# Patient Record
Sex: Female | Born: 1955 | Race: White | Hispanic: No | Marital: Single | Smoking: Former smoker
Health system: Western US, Academic
[De-identification: ages and names within clinical notes are randomized; demographics above are authoritative.]

## PROBLEM LIST (undated history)

## (undated) DIAGNOSIS — M329 Systemic lupus erythematosus, unspecified: Secondary | ICD-10-CM

## (undated) DIAGNOSIS — E039 Hypothyroidism, unspecified: Secondary | ICD-10-CM

## (undated) DIAGNOSIS — I2721 Secondary pulmonary arterial hypertension: Secondary | ICD-10-CM

## (undated) HISTORY — PX: NO PAST SURGICAL HISTORY: U1045

## (undated) HISTORY — DX: Hypothyroidism, unspecified: E03.9

## (undated) HISTORY — DX: Secondary pulmonary arterial hypertension (CMS-HCC): I27.21

## (undated) HISTORY — PX: THYROIDECTOMY: SHX17

## (undated) HISTORY — PX: PARATHYROIDECTOMY: SHX19

## (undated) HISTORY — DX: Systemic lupus erythematosus, unspecified (CMS-HCC): M32.9

---

## 2001-07-13 ENCOUNTER — Other Ambulatory Visit (INDEPENDENT_AMBULATORY_CARE_PROVIDER_SITE_OTHER): Payer: Self-pay | Admitting: Internal Medicine

## 2001-10-12 ENCOUNTER — Other Ambulatory Visit (INDEPENDENT_AMBULATORY_CARE_PROVIDER_SITE_OTHER): Payer: Self-pay | Admitting: Internal Medicine

## 2001-10-12 LAB — APTT, BLOOD
PTT, Control: 29 s
PTT: 28.7 s (ref 25.0–33.0)

## 2001-10-12 LAB — HEMOGRAM, BLOOD
Hct: 39.2 % (ref 36.0–46.0)
Hgb: 13 gm/dL (ref 12.0–16.0)
MCH: 29.5 pg (ref 27–31)
MCHC: 33.1 % (ref 32–37)
MCV: 89.3 um3 (ref 82.0–98.0)
MPV: 10.2 fl (ref 7.4–10.4)
Plt Count: 141 10*3/uL (ref 130–400)
RBC: 4.39 10*6/uL (ref 4.00–5.00)
RDW: 14.6 % (ref 10–15)
WBC: 3.9 10*3/uL — ABNORMAL LOW (ref 4.0–11.0)

## 2001-10-12 LAB — BASIC METABOLIC PANEL, BLOOD
BUN: 17 mg/dL (ref 8–18)
Bicarbonate: 27 mEq/L (ref 24–31)
Calcium: 9.3 mg/dL (ref 9.0–10.6)
Chloride: 105 mEq/L (ref 97–107)
Creatinine: 0.7 mg/dL (ref 0.5–1.5)
Glucose: 97 mg/dL (ref 65–110)
Potassium: 3.7 mEq/L (ref 3.5–5.0)
Sodium: 141 mEq/L (ref 135–145)

## 2001-10-12 LAB — LIVER PANEL, BLOOD
ALT (SGPT): 13 IU/L (ref 10–45)
AST (SGOT): 17 IU/L (ref 10–45)
Albumin: 3.6 gm/dL (ref 3.3–5.0)
Alkaline Phos: 45 IU/L (ref 30–130)
Bilirubin, Dir: <0.1 mg/dL (ref <0.2)
Bilirubin, Tot: 0.6 mg/dL (ref <1.2)
Total Protein: 6 gm/dL (ref 6.0–8.0)

## 2001-10-12 LAB — PROTHROMBIN TIME, BLOOD
INR: 1
Protime, Control: 10.1 s
Protime: 10.2 s (ref 9–12)

## 2002-05-15 ENCOUNTER — Other Ambulatory Visit (HOSPITAL_BASED_OUTPATIENT_CLINIC_OR_DEPARTMENT_OTHER): Payer: Self-pay | Admitting: Rheumatology

## 2002-05-15 LAB — BASIC METABOLIC PANEL, BLOOD
BUN: 13 mg/dL (ref 8–18)
Bicarbonate: 29 mEq/L (ref 24–31)
Calcium: 10.8 mg/dL — ABNORMAL HIGH (ref 9.0–10.6)
Chloride: 106 mEq/L (ref 97–107)
Creatinine: 0.7 mg/dL (ref 0.5–1.5)
Glucose: 118 mg/dL — ABNORMAL HIGH (ref 65–110)
Potassium: 3.9 mEq/L (ref 3.5–5.0)
Sodium: 143 mEq/L (ref 135–145)

## 2002-05-15 LAB — HEMOGRAM, BLOOD
Hct: 37.5 % (ref 36.0–46.0)
Hgb: 12.9 gm/dL (ref 12.0–16.0)
MCH: 28.5 pg (ref 27–31)
MCHC: 34.4 % (ref 32–37)
MCV: 82.9 um3 (ref 82.0–98.0)
MPV: 10.6 fl — ABNORMAL HIGH (ref 7.4–10.4)
Plt Count: 174 10*3/uL (ref 130–400)
RBC: 4.52 10*6/uL (ref 4.00–5.00)
RDW: 14.4 % (ref 10–15)
WBC: 5.1 10*3/uL (ref 4.0–11.0)

## 2002-05-15 LAB — URINALYSIS
Bilirubin: NEGATIVE
Blood: NEGATIVE
Glucose: NEGATIVE
Ketones: NEGATIVE
Leuk Esterase: NEGATIVE
Nitrite: NEGATIVE
Protein: NEGATIVE
Specific Gravity: 1.015 (ref 1.002–1.030)
Urobilinogen: 0.2 (ref 0.2–?)
pH: 8 (ref 5.0–8.0)

## 2002-05-15 LAB — SED RATE, BLOOD: Sed Rate: 5 mm/hr (ref 0–20)

## 2002-05-17 LAB — C3 THORNTON, BLOOD: C3: 88 mg/dL (ref 88–168)

## 2002-05-17 LAB — C4 THORNTON, BLOOD: C4: 18 mg/dL (ref 12–40)

## 2002-07-02 ENCOUNTER — Other Ambulatory Visit (INDEPENDENT_AMBULATORY_CARE_PROVIDER_SITE_OTHER): Payer: Self-pay | Admitting: Internal Medicine

## 2002-07-02 LAB — LIVER PANEL, BLOOD
ALT (SGPT): 24 IU/L (ref 10–45)
AST (SGOT): 24 IU/L (ref 10–45)
Albumin: 3.3 gm/dL (ref 3.3–5.0)
Alkaline Phos: 70 IU/L (ref 30–130)
Bilirubin, Dir: 0.1 mg/dL (ref <0.2)
Bilirubin, Tot: 0.7 mg/dL (ref <1.2)
Total Protein: 5.8 gm/dL — ABNORMAL LOW (ref 6.0–8.0)

## 2002-07-02 LAB — APTT, BLOOD
PTT, Control: 29 s
PTT: 29.5 s (ref 25.0–33.0)

## 2002-07-02 LAB — PROTHROMBIN TIME, BLOOD
INR: 1
Protime, Control: 10.1 s
Protime: 10.4 s (ref 9–12)

## 2002-07-02 LAB — BASIC METABOLIC PANEL, BLOOD
BUN: 13 mg/dL (ref 8–18)
Bicarbonate: 27 mEq/L (ref 24–31)
Calcium: 10.4 mg/dL (ref 9.0–10.6)
Chloride: 109 mEq/L — ABNORMAL HIGH (ref 97–107)
Creatinine: 0.7 mg/dL (ref 0.5–1.5)
Glucose: 93 mg/dL (ref 65–110)
Potassium: 3.8 mEq/L (ref 3.5–5.0)
Sodium: 142 mEq/L (ref 135–145)

## 2002-07-02 LAB — HEMOGRAM, BLOOD
Hct: 37.8 % (ref 36.0–46.0)
Hgb: 12.6 gm/dL (ref 12.0–16.0)
MCH: 27.8 pg (ref 27–31)
MCHC: 33.4 % (ref 32–37)
MCV: 83.3 um3 (ref 82.0–98.0)
MPV: 10.1 fl (ref 7.4–10.4)
Plt Count: 133 10*3/uL (ref 130–400)
RBC: 4.54 10*6/uL (ref 4.00–5.00)
RDW: 14.4 % (ref 10–15)
WBC: 3.9 10*3/uL — ABNORMAL LOW (ref 4.0–11.0)

## 2002-07-02 LAB — TSH SENSITIVE, BLOOD: TSH Sensitive: <0.03 u[IU]/mL (ref 0.35–5.50)

## 2002-07-02 LAB — TOTAL T3, BLOOD: T3 Total: 335 ng/dL — ABNORMAL HIGH (ref 60–181)

## 2002-07-02 LAB — THYROXINE(T4), BLOOD: Thyroxine (T4): 14.6 ug/dL — ABNORMAL HIGH (ref 4.5–10.9)

## 2002-07-02 LAB — FREE THYROXINE, BLOOD: Free T4: 3.66 ng/dL — ABNORMAL HIGH (ref 0.90–1.80)

## 2004-06-02 ENCOUNTER — Encounter (HOSPITAL_BASED_OUTPATIENT_CLINIC_OR_DEPARTMENT_OTHER): Admitting: Rheumatology

## 2004-06-16 ENCOUNTER — Ambulatory Visit (HOSPITAL_BASED_OUTPATIENT_CLINIC_OR_DEPARTMENT_OTHER): Admitting: Rheumatology

## 2004-06-17 NOTE — Progress Notes (Signed)
CLINIC: [facility]      REPORT TYPE: NOTE      Dictating Practitioner: Eugenia Mcalpine. Laverle Patter, M.D.    DATE OF SERVICE: 06/16/2004    REASON FOR VISIT: SLE        SUBJECTIVE: Wanda Mathis reports she has had an excellent year since the last  visit, although she did have medical problems in other spheres, but nothing  related to her lupus. She was found to have hyperparathyroidism and also a  recurrence of thyroiditis and she had both parathyroid and thyroid gland  removed surgically. She has done much better postop. Throughout all that,  her lupus did not flare. There was no eruption of dermatitis and she had  no flaring joint symptoms. She maintained her stable regimen of Plaquenil  200 mg twice a day and prednisone 6 mg daily. Her pulmonary hypertension  is remaining under control and she continues to take Tracleer 250 mg daily.  She also remains on Coumadin for prophylaxis for DVT.    PHYSICAL EXAMINATION: Physical examination confirms the absence of skin  rash and joint inflammation.    LABORATORY STUDIES: Laboratory studies done six months ago showed no sign  of lupus activity.    ASSESSMENT/PLAN: She is doing well. We will make no change in her regimen  now. We will repeat labs screening for lupus activity and will have them  done every three months.                        Signature Derived From Controlled Access Password  Eugenia Mcalpine. Laverle Patter, M.D. 06/17/2004 13:39          DD: 06/16/2004 DT: 06/17/2004 6:40 A DocNo.: [id number]  [id number]/[id number]      Primary Care Physician:  UNKNOWN     PCP_Address2      cc:

## 2006-09-18 ENCOUNTER — Ambulatory Visit (INDEPENDENT_AMBULATORY_CARE_PROVIDER_SITE_OTHER): Admitting: Internal Medicine

## 2006-09-18 ENCOUNTER — Encounter (INDEPENDENT_AMBULATORY_CARE_PROVIDER_SITE_OTHER): Payer: Self-pay | Admitting: Internal Medicine

## 2006-09-18 ENCOUNTER — Ambulatory Visit (INDEPENDENT_AMBULATORY_CARE_PROVIDER_SITE_OTHER)

## 2006-09-18 ENCOUNTER — Other Ambulatory Visit: Payer: Self-pay

## 2006-09-18 VITALS — BP 122/76 | HR 93 | Ht 69.0 in | Wt 190.0 lb

## 2006-09-18 MED ORDER — SYNTHROID 175 MCG OR TABS: 135.00 ug | ORAL_TABLET | Freq: Every day | ORAL | Status: AC

## 2006-09-18 MED ORDER — PREDNISONE 1 MG OR TABS
ORAL_TABLET | ORAL | Status: DC
Start: ? — End: 2014-01-13

## 2006-09-18 MED ORDER — AMITRIPTYLINE HCL 25 MG OR TABS
ORAL_TABLET | ORAL | Status: DC
Start: ? — End: 2019-01-21

## 2006-09-18 MED ORDER — WARFARIN SODIUM 6 MG OR TABS
ORAL_TABLET | ORAL | Status: DC
Start: ? — End: 2019-01-21

## 2006-09-18 MED ORDER — PREDNISONE 5 MG OR TABS
ORAL_TABLET | ORAL | Status: DC
Start: ? — End: 2014-01-13

## 2006-09-18 MED ORDER — LEVOXYL 175 MCG OR TABS
ORAL_TABLET | ORAL | Status: DC
Start: ? — End: 2008-02-04

## 2006-09-18 MED ORDER — PLAQUENIL 200 MG OR TABS
ORAL_TABLET | ORAL | Status: DC
Start: ? — End: 2014-01-13

## 2006-09-19 LAB — PB ECHOCARDIOGRAM, 2-D
Cardac Output: 6.5 L/min (ref 4.0–8.0)
Cardiac Index: 3.3 L/min/m2 (ref 2.8–4.2)
LA Volume Index: 28 mL/m2 (ref 16–28)
LV Ejection Fraction: 78 % (ref >50)
PA Pressure: 67 mm Hg + CVP — ABNORMAL HIGH (ref 20–30)

## 2006-09-21 NOTE — Progress Notes (Signed)
CLINIC: [facility]    REPORT TYPE: NOTE    Dictating Practitioner: Ewing Schlein. Kirke Shaggy, M.D.    DATE OF SERVICE: 09/18/2006    REASON FOR VISIT: FOLLOWUP      HISTORY OF PRESENT ILLNESS: The patient returns for a followup visit. We  have not seen her in 3 years. She has pulmonary arterial hypertension  associated with lupus. Overall, the patient has been maintained on bosentan  125 b.i.d. single therapy for the past few years. She had been troubled  with severe thyrotoxicosis due to Graves disease. She has unilateral  exophthalmos, for which she is scheduled to undergo a surgical correction.  Her ophthalmologist suggested she come down here for a preoperative  evaluation and clearance.    From an activity point of view, the patient states she is doing extremely  well. She is very active, running several companies, traveling extensively,  and notes minimal limitation to her ordinary activity. She is also on  warfarin 6 alternating with 9 daily.    PHYSICAL EXAMINATION: Blood pressure 122/76, heart rate 93, O2 saturation  96% on room air. No jugular venous distention is noted. LUNGS: Clear.  HEART: Regular, with a prominent P2 and systolic murmur at the left  sternal border. She has no peripheral edema noted.    Echocardiogram today, on preliminary report, demonstrates mild right atrial  and right ventricular enlargement, normal left ventricular function,  pulmonary artery systolic pressure estimated at 67 plus CVP.    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 I think  given the length of time since we evaluated Ms. Romaniello it is very  reasonable to perform a repeat right heart catheterization within the next  week. Based on the results of this procedure, we will consider additional  medical  therapy in the patient. I would also hold off on firm  recommendations regarding her surgical risk until I have results of her  right heart cath.                        Signature Derived From Controlled Access Password  Kye Hedden N. Kirke Shaggy, M.D. 09/21/2006 09:32 A        DD: 09/18/2006 DT: 09/19/2006 04:55 A DocNo.: [id number]  RNC/r11 [id number]      Primary Care Physician:  Christ Kick MD        cc:

## 2006-09-21 NOTE — Progress Notes (Signed)
This office note has been dictated.

## 2006-09-26 ENCOUNTER — Ambulatory Visit: Admitting: Internal Medicine

## 2006-09-26 ENCOUNTER — Encounter (INDEPENDENT_AMBULATORY_CARE_PROVIDER_SITE_OTHER): Payer: Self-pay | Admitting: Internal Medicine

## 2006-09-26 ENCOUNTER — Other Ambulatory Visit (INDEPENDENT_AMBULATORY_CARE_PROVIDER_SITE_OTHER): Payer: Self-pay | Admitting: Internal Medicine

## 2006-09-26 LAB — HEMOGRAM, BLOOD
Hct: 41.4 % (ref 36.0–46.0)
Hgb: 14 gm/dL (ref 12.0–16.0)
MCH: 30.8 pg (ref 27–31)
MCHC: 33.7 % (ref 32–37)
MCV: 91.2 um3 (ref 82.0–98.0)
MPV: 8.6 fl (ref 7.4–10.4)
Plt Count: 163 10*3/uL (ref 130–400)
RBC: 4.54 10*6/uL (ref 4.00–5.00)
RDW: 15.3 % — ABNORMAL HIGH (ref 10–15)
WBC: 5.4 10*3/uL (ref 4.0–11.0)

## 2006-09-26 LAB — COMPREHENSIVE METABOLIC PANEL, BLOOD
ALT (SGPT): 17 IU/L (ref 10–45)
AST (SGOT): 20 IU/L (ref 10–45)
Albumin: 3.9 gm/dL (ref 3.3–5.0)
Alkaline Phos: 39 IU/L (ref 30–130)
BUN: 12 mg/dL (ref 8–18)
Bicarbonate: 26 mEq/L (ref 24–31)
Bilirubin, Tot: 0.8 mg/dL (ref <1.2)
Calcium: 9 mg/dL (ref 8.8–10.3)
Chloride: 106 mEq/L (ref 97–107)
Creatinine: 0.6 mg/dL (ref 0.5–1.5)
GFR (African Amer.): >60 mL/min
GFR: >60 mL/min
Glucose: 89 mg/dL (ref 65–110)
Potassium: 3.5 mEq/L (ref 3.5–5.0)
Sodium: 143 mEq/L (ref 135–145)
Total Protein: 6.6 gm/dL (ref 6.0–8.0)

## 2006-09-26 LAB — PROTHROMBIN TIME, BLOOD
INR: 1
PT,Patient: 11.4 s (ref 9.7–12.5)

## 2006-09-26 LAB — APTT, BLOOD: PTT: 36.5 s — ABNORMAL HIGH (ref 25.0–34.0)

## 2006-09-26 NOTE — Op Note (Signed)
Dictating Practitioner: Ewing Schlein. Kirke Shaggy, M.D.     Staff Physician: Ewing Schlein. Kirke Shaggy, M.D.    Date of Operation: 09/26/2006        PREPROCEDURE DIAGNOSIS: Chronic pulmonary arterial hypertension.  POSTPROCEDURE DIAGNOSIS: Chronic pulmonary arterial hypertension.  PROCEDURE: Right heart catheterization.  SURGEON/STAFF: R. Bryten Maher    PROCEDURE: After informed consent, the patient was prepped and draped in  the usual fashion. An 8-French introducer sheath was placed in right  femoral vein without difficulty. The pulmonary artery catheter was  advanced fluoroscopically into the right pulmonary artery and the procedure  completed without complications.    FINDINGS: 1. Hemodynamically: RA = 3. PA = 70/26 (mean 43). Wedge  pressure = 7. Cardiac output = 6.03 with index of 3.02, PVR 477  dyn/sec/cm-5, and PA saturation of 77%.    With inhalation of nitric oxide there was a modest reduction in PA pressure  to 64/25 (mean 39), wedge pressure 8, right atrial pressure = 6, cardiac  output 5.43 with index of 2.7, PVR 456, and PA saturation of 79%.    IMPRESSION: Pulmonary arterial hypertension associated with lupus. There  was some modest increase in the pulmonary vascular resistance with a small  response to inhaled nitric oxide.    PLAN: We discussed with the patient. Would favor addition of Revatio 20  mg t.i.d. Another option would be inhaled iloprost. Will discuss with the  patient.                              Signature Derived From Controlled Access Password  Harshaan Whang N. Kirke Shaggy, M.D. 09/26/2006 03:38 P    DD: 09/26/2006 DT: 09/26/2006 03:21 P DocNo.: [id number]  RNC/r10 [id number].[facility]       Primary Care Physician:   Christ Kick MD        cc:

## 2006-10-17 ENCOUNTER — Encounter (INDEPENDENT_AMBULATORY_CARE_PROVIDER_SITE_OTHER): Payer: Self-pay | Admitting: Internal Medicine

## 2006-11-03 NOTE — Progress Notes (Signed)
CLINIC: [facility]    REPORT TYPE: NOTE    Dictating Practitioner: Ewing Schlein. Naftula Donahue, M.D.    DATE OF SERVICE: 10/17/2006    REASON FOR VISIT: SUPPLEMENTAL NOTE    The patient has pulmonary arterial hypertension associated with connective  tissue disease. A recent right heart catheterization demonstrated moderate  pulmonary hypertension with good cardiac output. The patient needs to have  surgery on her eye for severe unilateral exophthalmos. From a  medical/pulmonary point of view, the patient is cleared to have surgery.  She will continue her pulmonary hypertension medications, but should not  have a problem undergoing surgical correction for her eye problem.                    Signature Derived From Controlled Access Password  Montario Zilka N. Kirke Shaggy, M.D. 11/03/2006 09:59 A        DD: 10/17/2006 DT: 10/19/2006 01:41 P DocNo.: [id number]  RNC/r11 [id number]      Primary Care Physician:  Hermenia Bers MD  [facility]  [address] [place], [place] [address]    cc:

## 2007-01-15 ENCOUNTER — Encounter (INDEPENDENT_AMBULATORY_CARE_PROVIDER_SITE_OTHER): Payer: Self-pay | Admitting: Internal Medicine

## 2007-01-15 ENCOUNTER — Ambulatory Visit (INDEPENDENT_AMBULATORY_CARE_PROVIDER_SITE_OTHER): Admitting: Internal Medicine

## 2007-01-15 ENCOUNTER — Ambulatory Visit (INDEPENDENT_AMBULATORY_CARE_PROVIDER_SITE_OTHER)

## 2007-01-15 VITALS — BP 120/70 | HR 77 | Ht 69.5 in | Wt 181.0 lb

## 2007-01-15 LAB — PB ECHOCARDIOGRAM, 2-D
LV Ejection Fraction: 67 % (ref >50)
PA Pressure: 49 mm Hg + CVP — ABNORMAL HIGH (ref 20–30)

## 2007-01-15 MED ORDER — REVATIO 20 MG OR TABS
ORAL_TABLET | ORAL | Status: DC
Start: ? — End: 2008-02-04

## 2007-01-15 NOTE — Progress Notes (Signed)
This office note has been dictated.

## 2007-01-24 NOTE — Progress Notes (Signed)
CLINIC: [facility]    REPORT TYPE: NOTE    Dictating Practitioner: Ewing Schlein. Yitzchak Kothari, M.D.    DATE OF SERVICE: 01/15/2007    REASON FOR VISIT: FOLLOWUP      HISTORY OF PRESENT ILLNESS: The patient returns today for a followup visit.  Since her previous right heart catheterization in July, we added sildenafil  20 t.i.d. to her regimen. Since that time, she continues to do very well.  She has no real symptomatic limitation. She is also on bosentan 125 b.i.d.  In August, she had surgery to correct her chronic eye problem related to  hypothyroidism. This went well, although she is still healing from this  procedure.    PHYSICAL EXAMINATION: VITAL SIGNS: Blood pressure was 120/70, heart rate  77, O2 saturation 100% on room air. NECK: There is no jugular venous  distention noted. LUNGS: Clear. HEART: Regular, with normal S1, S2.  EXTREMITIES: There is no peripheral edema noted.    STUDIES: A 6-minute walk distance today was 442 m with O2 saturation 92% at  the end of exercise.    Echocardiogram today demonstrates moderate right ventricular enlargement  with estimated pulmonary artery pressure of 49 per CVP, significantly  reduced compared to her prior echocardiogram.    IMPRESSION: Pulmonary arterial hypertension associated with connective  tissue disease. The patient clinically is doing well on combination medical  therapy, with echocardiogram showing some improvement. We will not make any  changes at this point. See the patient back in approximately 6 months.                        Signature Derived From Controlled Access Password  Lateria Alderman N. Kirke Shaggy, M.D. 01/24/2007 03:07 P        DD: 01/15/2007 DT: 01/15/2007 01:36 P DocNo.: [id number]  RNC/r11 [id number]      Primary Care Physician:  Hermenia Bers MD  [facility]  [place], [place] [address]    cc:

## 2007-07-30 ENCOUNTER — Encounter (INDEPENDENT_AMBULATORY_CARE_PROVIDER_SITE_OTHER): Admitting: Internal Medicine

## 2007-07-30 ENCOUNTER — Other Ambulatory Visit (INDEPENDENT_AMBULATORY_CARE_PROVIDER_SITE_OTHER)

## 2007-09-03 ENCOUNTER — Other Ambulatory Visit (INDEPENDENT_AMBULATORY_CARE_PROVIDER_SITE_OTHER): Payer: Self-pay

## 2007-09-03 ENCOUNTER — Encounter (INDEPENDENT_AMBULATORY_CARE_PROVIDER_SITE_OTHER): Admitting: Internal Medicine

## 2007-09-17 ENCOUNTER — Encounter (HOSPITAL_COMMUNITY): Payer: Self-pay

## 2007-09-17 ENCOUNTER — Other Ambulatory Visit (INDEPENDENT_AMBULATORY_CARE_PROVIDER_SITE_OTHER)

## 2007-09-17 ENCOUNTER — Encounter (INDEPENDENT_AMBULATORY_CARE_PROVIDER_SITE_OTHER): Admitting: Internal Medicine

## 2007-11-05 ENCOUNTER — Ambulatory Visit (INDEPENDENT_AMBULATORY_CARE_PROVIDER_SITE_OTHER): Admitting: Internal Medicine

## 2007-11-05 ENCOUNTER — Ambulatory Visit (INDEPENDENT_AMBULATORY_CARE_PROVIDER_SITE_OTHER)

## 2007-11-05 ENCOUNTER — Other Ambulatory Visit: Payer: Self-pay

## 2007-11-05 ENCOUNTER — Encounter (HOSPITAL_COMMUNITY): Payer: Self-pay

## 2007-11-05 ENCOUNTER — Ambulatory Visit (HOSPITAL_COMMUNITY)

## 2007-11-06 LAB — PB ECHOCARDIOGRAM, 2-D
Cardac Output: 6.3 L/min (ref 4.0–8.0)
Cardiac Index: 3.2 L/min/m2 (ref 2.8–4.2)
LA Volume Index: 22 mL/m2 (ref 16–28)
LV Ejection Fraction: 68 % (ref >50)
PA Pressure: 54 mm Hg + CVP — ABNORMAL HIGH (ref 20–30)

## 2007-11-06 NOTE — Progress Notes (Signed)
CLINIC: [facility]    REPORT TYPE: H&P    Dictating Practitioner: Ewing Schlein. Kirke Shaggy, M.D.    DATE OF SERVICE: 11/05/2007    REASON FOR VISIT: FOLLOWUP      REASON FOR VISIT: Followup.    HISTORY OF PRESENT ILLNESS: The patient returns for a followup visit.  Overall, she is doing quite well. She continues to be quite active with  virtually normal exercise tolerance. She continues on her current medical  regimen of bosentan 125 b.i.d. and sildenafil 20 mg t.i.d.    PHYSICAL EXAMINATION: GENERAL: The patient is comfortable at rest. LUNGS:  Clear. HEART: Regular with a soft systolic murmur at the left sternal  border. ABDOMEN: Benign. EXTREMITIES: There is no peripheral edema  noted.    The 6 minute walk distance is 443 m with O2 saturation 90% at the end of  exercise.    Echocardiogram today demonstrates moderate right ventricular enlargement  with normal systolic function, estimated pulmonary artery pressure 54+  CVP.    IMPRESSION: Pulmonary arterial hypertension associated with connective  tissue disease. The patient clinically is doing reasonably well although  still has echocardiographic evidence for pulmonary hypertension. I think it  is reasonable to switch her sildenafil to tadalafil 40 mg daily following  initial 20 mg daily for one week. We will try to make the switch and I  would like to see the patient back 1 month after this change in therapy.  Otherwise, would continue unchanged her current therapy.                        Electronically signed by:  Ewing Schlein. Xolani Degracia, M.D. 11/06/2007 10:08 A        DD: 11/05/2007 DT: 11/05/2007 05:00 P DocNo.: [id number]  RNC/r11 [id number].DOM    Referring Physician:  Fransico Michael MD  [address]  [place] [address]    Primary Care Physician:  Hermenia Bers MD  [facility]  [address] [place], [place] [address]    cc:

## 2008-02-03 NOTE — Progress Notes (Signed)
This encounter is being closed administratively for the purposes of statistical clarity and integrity of the record.  The act of closure provides no guarantee of clinical oversight of contents, which are solely in the purview of the encounter provider(s).  The closing physician is acting on the authority of the Medical Records and Informatics Oversight Committee of the [facility] in sealing this encounter to prevent inadvertent late entry of clinical information.

## 2008-02-04 ENCOUNTER — Ambulatory Visit (HOSPITAL_COMMUNITY)

## 2008-02-04 ENCOUNTER — Other Ambulatory Visit: Payer: Self-pay

## 2008-02-04 ENCOUNTER — Ambulatory Visit (INDEPENDENT_AMBULATORY_CARE_PROVIDER_SITE_OTHER): Admitting: Internal Medicine

## 2008-02-04 VITALS — BP 118/83 | HR 80 | Ht 69.5 in | Wt 175.0 lb

## 2008-02-04 MED ORDER — TADALAFIL (PAH) 20 MG PO TABS
40.00 mg | ORAL_TABLET | Freq: Every day | ORAL | Status: DC
Start: ? — End: 2018-01-22

## 2008-02-19 NOTE — Progress Notes (Signed)
CLINIC: [facility]    REPORT TYPE: H&P    Dictating Practitioner: Ewing Schlein. Kirke Shaggy, M.D.    DATE OF SERVICE: 02/04/2008    REASON FOR VISIT: FOLLOWUP      SUBJECTIVE: This is a followup visit. She continues to do very well. She  is on tadalafil 40 mg daily, as well as bosentan 125 b.i.d. Her breathing  is quite good, class 1 status.    PHYSICAL EXAMINATION: VITAL SIGNS: Blood pressure 118/83, heart rate 80,  O2 saturation 94% on room air. NECK: There is no jugular venous  distention noted. LUNGS: Clear. HEART: Regular without murmurs or rubs.  ABDOMEN: Benign. EXTREMITIES: No peripheral edema.    IMPRESSION: Pulmonary arterial hypertension associated with connective  tissue disease. Follow the patient clinically, is doing very well, with no  significant functional limitation. She is tolerating the tadalafil 40 mg  daily very well. Will continue current regimen and have her return in 6  months.                        Electronically signed by:  Ewing Schlein. Kirke Shaggy, M.D. 02/19/2008 01:48 P        DD: 02/04/2008 DT: 02/04/2008 03:53 P DocNo.: [id number]  RNC/r11 [id number]    Referring Physician:  Hermenia Bers MD  [facility]  [place] [address]    Primary Care Physician:  Hermenia Bers MD  [address]  [address]  [place], [place] [address]    cc:

## 2008-04-16 NOTE — Progress Notes (Signed)
This encounter is being closed administratively for the purposes of statistical clarity and integrity of the record.  The act of closure provides no guarantee of clinical oversight of contents, which are solely in the purview of the encounter provider(s).  The closing physician is acting on the authority of the Medical Records and Informatics Oversight Committee of the [facility] in sealing this encounter to prevent inadvertent late entry of clinical information.

## 2008-07-14 ENCOUNTER — Encounter (INDEPENDENT_AMBULATORY_CARE_PROVIDER_SITE_OTHER): Payer: Self-pay | Admitting: Hematology

## 2008-07-14 ENCOUNTER — Other Ambulatory Visit: Payer: Self-pay

## 2008-07-14 ENCOUNTER — Ambulatory Visit (HOSPITAL_COMMUNITY)

## 2008-07-14 ENCOUNTER — Ambulatory Visit (INDEPENDENT_AMBULATORY_CARE_PROVIDER_SITE_OTHER): Admitting: Internal Medicine

## 2008-07-14 MED ORDER — BOSENTAN 125 MG OR TABS
125.00 mg | ORAL_TABLET | Freq: Two times a day (BID) | ORAL | Status: DC
Start: ? — End: 2017-04-19

## 2008-07-14 NOTE — Progress Notes (Signed)
Pulmonary hypertension clinic [facility]     REASON FOR VISIT: FOLLOWUP  Attending physician Dr Selena Batten    HISTORY OF PRESENT ILLNESS: 53 yr old female with history of idiopathic pulmonary artery hypertension is here for follow up.  She was diagnosed with idiopathic pulmonary hypertension in 1996 when she was admitted  to hospital with shortness of breath and had cardiac cath which showed pulmonary artery hypertension.She has history of SLE and Graves disease and has thyroidectomy now on replacement levothyroxine.She was enrolled in remodulin trial and was on remodulin for 10 years finishing in 2004.She was started on revatio which was changed to tadalafil and bosentan and was also started on coumadin.  Since that time, she continues to do very well.  She has no real symptomatic limitation.She can walk for miles with out getting sob.She had six minute exercise test which showed that she is able to walk 446 meters.She is compliant with medication and she denies any gum bleeding,bleeding from other body part,denies any chest pain ,fatigue or any other symptoms.       Current outpatient prescriptions ordered prior to encounter   Medication Sig Dispense Refill   . Tadalafil, PAH, (ADCIRCA) 20 MG TABS Take 40 mg by mouth daily.       Marland Kitchen PREDNISONE 5 MG OR TABS 1 tab daily       . PREDNISONE 1 MG OR TABS 1 tab daily       . levothyroxine (SYNTHROID) 175 MCG tablet Take 175 mcg by mouth daily.       . WARFARIN SODIUM 6 MG OR TABS 1 tab daily alt with 1 1/2 tab every other day       . PLAQUENIL 200 MG OR TABS 1 tab twice daily       . AMITRIPTYLINE HCL 25 MG OR TABS 1 tab at bedtime           Filed Vitals:    07/14/2008  2:31 PM   BP: 125/90   Pulse: 92   Height: 5\' 9"  (1.753 m)   Weight: 185 lb (83.915 kg)   SpO2: 99%     has ulnar deviation of both hands.  chest clear to auscultation ,no added sounds.  CVS:RRR,,jvp 5 cm ,No peripheral edema.normal s1, pulmonary component accentuated,has systolic TR murmur.  Abdomen:soft,non  tender,non distended.    Assessment:53 yr old female is in pulmonary clinic for follow up of her idiopathic pulmonary arterial hypertension and she is well compensated and asymptomatic with medications.    Plan:  -advised to continue bosentan 125mg  bid  -advised to take tadalafil 40mg  daily and medication was refilled.  -advised to continue coumadin and the goal INR is 1.5-2 and needs to follow up with coumadin clinic.  F/u 6 months.  Discussed with Dr Selena Batten.

## 2008-07-21 NOTE — Progress Notes (Signed)
I examined the patient and discussed with Dr. Randel Books.  I reviewed all pertinent data.  Agree with his recs.  Continue combination oral therapy for PAH.  RTC in 6 months or sooner if needed.  Hansel Starling, MD

## 2008-12-15 ENCOUNTER — Ambulatory Visit (INDEPENDENT_AMBULATORY_CARE_PROVIDER_SITE_OTHER): Admitting: Internal Medicine

## 2008-12-15 NOTE — Progress Notes (Signed)
See dictation

## 2008-12-16 ENCOUNTER — Ambulatory Visit
Admit: 2008-12-16 | Discharge: 2008-12-08 | Payer: Self-pay | Source: Ambulatory Visit | Attending: Internal Medicine | Admitting: Internal Medicine

## 2008-12-16 ENCOUNTER — Encounter (INDEPENDENT_AMBULATORY_CARE_PROVIDER_SITE_OTHER): Payer: Self-pay | Admitting: Internal Medicine

## 2008-12-18 NOTE — Progress Notes (Signed)
CLINIC: [facility]      REPORT TYPE: NOTE      Dictating Practitioner: Juliette Alcide, M.D.    DATE OF SERVICE: 12/15/2008    REASON FOR VISIT: PULMONARY HYPERTENSION          PULMONARY HYPERTENSION CLINIC AT [facility]    HISTORY OF PRESENT ILLNESS: The patient was last seen in April. She lives  in [place]. She is scheduled for a followup right heart  catheterization tomorrow. The patient is anticipating left shoulder  arthroscopic surgery. I am told this would be under general anesthesia and  estimated to take approximately 2 hours. There are some concerns, both from  the patient and local doctors regarding her recent echocardiogram. I do not  have the formal report. Per patient, her PA systolic pressure was estimated  in the 70s. Part of her visit here is to obtain medical clearance in light  of her pulmonary arterial hypertension and anticipated surgery.    On history, she is functional class II. She is doing well and tolerating  all her medications. She has recently undergone some adjustments with her  Synthroid.    REVIEW OF SYSTEMS: Otherwise noncontributory.    MEDICATIONS  1. Tracleer 125 b.i.d.  2. Adcirca 40 mg daily.  3. Prednisone 6 mg daily.  4. Synthroid 175 mcg daily (needs to be verified).  5. Warfarin 6 mg 1 tablet alternating with 1-1/2 tablet daily.  6. Plaquenil 200 mg b.i.d.  7. Amitriptyline 25 mg at bedtime.    EXAMINATION  GENERAL: Pleasant, in no acute distress.  VITAL SIGNS: Blood pressure 113/78, pulse 87 and regular, 181 pounds,  saturating 97% on room air.  NECK: Exam findings significant for absent JVD.  CARDIAC: Regular rate and rhythm, with accentuated P2, a 3/6 TR, no RV  heave, no S3.  LUNGS: Clear to auscultation throughout.  ABDOMEN: Soft, nontender. No hepatomegaly.  EXTREMITIES: She has no peripheral cyanosis or edema.    IMPRESSION  1. Idiopathic pulmonary arterial hypertension.  2. Functional class II, on combination therapy.  3. History of SLE.  4. Requesting clearance  for left shoulder surgery under general  anesthesia.    RECOMMENDATIONS  1. I have explained to the patient the limitations of echocardiogram in  pulmonary arterial hypertension. Certainly, the one information of her PA  pressure estimate does not tell me whether she has other qualitative  changes to be concerned about. I am encouraged by her functional class II  status and my exam findings. We will also proceed with right heart  catheterization and compare it to her previous catheterizations. By  tomorrow, I will have a better idea of her risk assessment for undergoing  planned surgery.  2. Additional recommendations will be made following the right heart  catheterization.                    Electronically signed by:  Juliette Alcide, M.D. 12/18/2008 09:01 A        DD: 12/15/2008 DT: 12/15/2008 03:42 P DocNo.: [id number]  HSK/r11 [id number].DOM      Primary Care Physician:  Hermenia Bers MD  [address]  [address]  [place], [place] [address]    cc:

## 2008-12-18 NOTE — Op Note (Signed)
Dictating Practitioner: Juliette Alcide, M.D.     Staff Physician: Juliette Alcide, M.D.    Date of Operation: 12/16/2008            PREOPERATIVE DIAGNOSIS: Pulmonary arterial hypertension.    POSTOPERATIVE DIAGNOSES: Pulmonary arterial hypertension.    PROCEDURE: Right heart catheterization with acute vasodilator study.    SURGEON/STAFF: Devoria Albe, M.D.    PROCEDURE IN DETAIL: Consent was obtained prior to the procedure. Time-out  was performed. Right groin was prepped in the usual fashion. Lidocaine 2%  was injected locally, total volume 5 mL. An 8-French sheath was inserted  over a wire on first attempt without arterial puncture. A 7.5-French GTD  Swan-Ganz catheter with a 0.028 inner wire was advanced under fluoroscopic  guidance. Zero was confirmed prior to all measurements.    Blood pressure by cuff 112/72, pulse 76 in sinus, saturating 95% on room  air. CVP mean was 1. I ha no difficulty entering the pulmonary artery. RV  systolic was 66 with RV EDP 3, PA 59/20 (36), PAOP mean 5 and there  appeared to be a good waveform. PA saturation was 78%. Thermodilution  cardiac output 7.17, index 3.6. PVR 4.3 Wood units. Fick cardiac output  estimated at 8.18.    Following these measurements the patient was given inhaled nitric oxide 40  parts per million mixed with ambient air. On iNO, PA pressures recorded  53/17 (32), PAOP mean 5. Cardiac output 6.07, index 3.1, PVR 4.4. Following  these measurements both catheters were withdrawn. Hemostasis was achieved.  The patient was sent to recovery.    IMPRESSION  1. Pulmonary arterial hypertension.  2. No acute vasoreactivity.  3. Improved hemodynamics compared to her last catheterization in 2008.  4. Functional class 2, on combination oral therapies.    RECOMMENDATIONS: The results will be discussed with the patient. These are  encouraging findings. She is currently being screened for possible  enrollment to the [place] C study. In the meantime, otherwise,  continue  current therapies.                      Electronically signed by:  Juliette Alcide, M.D. 12/18/2008 09:01 A    DD: 12/16/2008 DT: 12/16/2008 02:58 P DocNo.: [id number]  HSK/r10 [id number].[facility]      Primary Care Physician:  Hermenia Bers MD  [address]  [address]  [place], [place] [address]    cc:

## 2009-03-02 ENCOUNTER — Other Ambulatory Visit: Payer: Self-pay

## 2010-01-07 NOTE — Procedures (Signed)
FINAL        Pulmonary Function Laboratory PATIENT Information    [name], Director Last Name:Haupt    [address] First Name:Qiara    [place], [place] [address] UE#:[id number] BJ#[id number]    Ph: [phone] PATIENT LOCATION:TOP    Technician:X.Tommie Ard M.D.: Kim       EXERCISE STUDY    DATE OF STUDY:03/02/09 8:45 AM    Test Status - Preliminary or   Final?:F    Age:54 Ht: cm Wt: kg Sex:F Purpose:6 Min Walk          Page 1 76f 1 [id number]       Position Sitting Stand --- --- --- ---      % FIO2 RA RA RA --- --- ---      Speed (MPH) Rest Rest Walk --- --- ---      Elevation (%) _ _ _ _    Duration (min) 10:00 5:00 6:00 _ _ _    ARTERIAL BLOOD    GASES    PaO2 (mmHg) _ _ _ _    PaCO2 (mmHg) _ _ _ _    PH _ _ _ _    %O2Hb (Co-oximeter) _ _ _ _ _    SpO2 96 95 93 _ _ _    CARDIOVASCULAR    Heart Rate (bpm) 72 84 99 _ _ _    BP systolic (mmHg) _ _ _ _ _ _    BP diastolic (mmHg) _ _ _ _ _ _    PERCEIVED SYMPTOMS (0 - 10)    Fatigue 0 0 0 _ _ _    Breathlessness 0 0 0.5 _ _ _          Hgb (gm/dl) COHb (gm%)       Technician Comments:Patient completed protocol and walked 427   meters.

## 2010-01-07 NOTE — Procedures (Signed)
FINAL        Pulmonary Function Laboratory PATIENT Information    [name], Director Last Name:Dennin    [address] First Name:Aldean    [place], [place] [address] GN#:[id number] VH#[id number]    Ph: [phone] Visit#:    PATIENT LOCATION:TOP    Technician:LS    REFERRING M.D.: Channick,R.       EXERCISE STUDY    (Date format mm/dd/yy hh:mm) DATE OF STUDY:01/15/07 11:20 AM    Test Status - Preliminary or   Final?:F    Age:54 Ht: cm Wt: kg Sex:F Purpose:6 Min Walk          Levels 1 to 6       Page 1 20f 1 6 R-E O2 report       Position Stand --- --- --- --- ---      % FIO2 RA RA --- --- --- ---      Speed (MPH) Rest Walk --- --- --- ---      Elevation (%) _ _ _ _ _ _    Duration (min) 5:00 6:00   _    ARTERIAL BLOOD    GASES    PaO2 (mmHg) _ _ _ _    PaCO2 (mmHg) _ _ _ _          PH _ _ _ _ _    %O2Hb (Co-oximeter) _ _ _ _ _    SpO2 100 92   _    CARDIOVASCULAR    Heart Rate (bpm) 77 97   _    BP systolic (mmHg) _ _ _ _    BP diastolic (mmHg) _ _ _ _    PERCEIVED SYMPTOMS (0 - 10)    Fatigue 0   _    Breathlessness 0.5   _          Hgb (gm/dl) COHb (gm%)       Technician Comments:Protocol completed and WALKED 442 Meters.                   Results of interim report not necessarily reviewed by attending  laboratory Physician.    Measured values are occasionally modified as result of the review   by  the laboratory Physician.

## 2010-01-07 NOTE — Procedures (Signed)
FINAL        Pulmonary Function Laboratory PATIENT Information    Wanda Mathis., Director Last Name:Mondesir    [address] First Name:Wanda Mathis    [place], [place] [address] UE#:[id number] BJ#[id number]    Ph: [phone] Visit#:    PATIENT LOCATION:TOP    Technician:JB    REFERRING M.D.: Channick       EXERCISE STUDY    (Date format mm/dd/yy hh:mm) DATE OF STUDY:02/04/08 2:35 PM    Test Status - Preliminary or   Final?:F    Age:54 Ht: cm Wt: kg Sex:F Purpose:6 Min Walk          Levels 1 to 6       Page 1 68f 1 6 R-E O2 report.doc       Position Sitting Stand --- --- --- ---      % FIO2 RA RA RA --- --- ---      Speed (MPH) Rest Rest Walk --- --- ---      Elevation (%) _ _ _ _ _ _    Duration (min) 10:00 5:00 6:00 _ _ _    ARTERIAL BLOOD    GASES    PaO2 (mmHg) _ _ _ _ _ _    PaCO2 (mmHg) _ _ _ _ _ _    PH _ _ _ _ _ _    %O2Hb (Co-oximeter) _ _ _ _ _ _    SpO2 97 94 90 _ _ _    CARDIOVASCULAR    Heart Rate (bpm) 81 93 120 _ _ _    BP systolic (mmHg) _ _ _ _ _ _    BP diastolic (mmHg) _ _ _ _ _ _    PERCEIVED SYMPTOMS (0 - 10)    Fatigue 0 0 0.5 _ _ _    Breathlessness 0 0 0.5 _ _ _          Hgb (gm/dl) COHb (gm%)       Technician Comments: Patient completed protocol and walked 482   meters.                        Results of interim report not necessarily reviewed by attending  laboratory Physician.    Measured values are occasionally modified as result of the review   by  the laboratory Physician.

## 2010-01-07 NOTE — Procedures (Signed)
FINAL        Pulmonary Function Laboratory PATIENT Information    [name], Director [name]    [address] First Name:Wanda    [place], [place] [address] YQ#:[id number] EX#[id number]    Ph: [phone] Visit#:    PATIENT LOCATION:TOP    Technician:JB    REFERRING M.D.: Kim       EXERCISE STUDY    (Date format mm/dd/yy hh:mm) DATE OF STUDY:07/14/08 1:50 PM    Test Status - Preliminary or   Final?:F    Age:54 Ht: cm Wt: kg Sex:F Purpose:6 Min Walk          Levels 1 to 6       Page 1 35f 1 6 R-E O2 report       Position --- Stand --- --- --- ---      % FIO2 RA RA RA --- --- ---      Speed (MPH) --- --- --- --- --- ---      Elevation (%) _ _ _ _ _ _    Duration (min) 10:00 5:00 6:00 _ _ _    ARTERIAL BLOOD    GASES    PaO2 (mmHg) _ _ _ _ _ _    PaCO2 (mmHg) _ _ _ _ _ _    PH _ _ _ _ _ _    %O2Hb (Co-oximeter) _ _ _ _ _ _    SpO2 96 97 92 _ _ _    CARDIOVASCULAR    Heart Rate (bpm) 96 107 129 _ _ _    BP systolic (mmHg) _ _ _ _ _ _    BP diastolic (mmHg) _ _ _ _ _ _    PERCEIVED SYMPTOMS (0 - 10)    Fatigue 0 0.5 _ _ _    Breathlessness 0 1 _ _ _          Hgb (gm/dl) COHb (gm%)       Technician Comments: Patient completed protocol and WALKED 495   meters.                      Results of interim report not necessarily reviewed by attending  laboratory Physician.    Measured values are occasionally modified as result of the review   by  the laboratory Physician.

## 2010-01-07 NOTE — Procedures (Signed)
FINAL        Pulmonary Function Laboratory PATIENT Information    [name], Director Last Name:Dowse    [address] First Name:Allie    [place], [place] [address] YQ#:[id number] EX#[id number]    Ph: [phone] Visit#:    PATIENT LOCATION:TOP    Technician:B.Sabino Gasser    REFERRING M.D.: Channick       EXERCISE STUDY    (Date format mm/dd/yy hh:mm) DATE OF STUDY:11/05/07 12:25 PM    Test Status - Preliminary or   Final?:F    Age:54 Ht:176.50cm Wt:82kg Sex:F Purpose:6 Min Walk       Levels 1 to 6       Page 1 64f 1 [id number]       Position Stand --- --- --- --- ---      % FIO2 RA RA --- --- --- ---      Speed (MPH) Rest Walk --- --- --- ---      Elevation (%) _ _ _ _ _ _    Duration (min) 5:00 6:00 _ _ _ _    ARTERIAL BLOOD    GASES    PaO2 (mmHg) _ _ _ _ _ _    PaCO2 (mmHg) _ _ _ _ _ _    PH _ _ _ _ _ _    %O2Hb (Co-oximeter) _ _ _ _ _ _    SpO2 96 90 _ _ _ _    CARDIOVASCULAR    Heart Rate (bpm) 109 126 _ _ _ _    BP systolic (mmHg) _ _ _ _ _ _    BP diastolic (mmHg) _ _ _ _ _ _    PERCEIVED SYMPTOMS (0 - 10)    Fatigue _ 2 _ _ _ _    Breathlessness _ 1 _ _ _ _          Hgb (gm/dl) COHb (gm%)       Technician Comments: Patient completed protocol and walked 443   meters.

## 2011-10-03 ENCOUNTER — Encounter (HOSPITAL_COMMUNITY): Payer: Self-pay

## 2011-10-26 ENCOUNTER — Encounter (HOSPITAL_COMMUNITY): Payer: Self-pay | Admitting: Internal Medicine

## 2011-11-16 ENCOUNTER — Ambulatory Visit (HOSPITAL_COMMUNITY): Payer: Managed Care, Other (non HMO) | Admitting: Internal Medicine

## 2011-11-16 ENCOUNTER — Encounter (HOSPITAL_COMMUNITY): Payer: Self-pay | Admitting: Internal Medicine

## 2011-11-16 ENCOUNTER — Inpatient Hospital Stay (HOSPITAL_COMMUNITY): Admit: 2011-11-16 | Discharge: 2011-11-16 | Payer: Managed Care, Other (non HMO)

## 2011-11-16 VITALS — BP 131/69 | HR 82 | Temp 97.9°F | Ht 68.5 in | Wt 180.0 lb

## 2011-11-16 NOTE — Procedures (Addendum)
FINAL  [Page 1]                       Pulmonary Function Laboratory   PATIENT Information             Philis Nettle., Director      [name]           [address]            First Name:Wanda           [place], [place] [address]             UE#:[id number]  BJ#[id number]            Ph: [phone]              PATIENT LOCATION:TOP                                           Technician:JIM Felicity Pellegrini                                           REFERRING M.D.: Selena Batten                                                                                      EXERCISE STUDY                                           DATE OF STUDY:11/16/2011 12:15 PM                                                Test Status - Preliminary or   Final?:F           Age:56 Ht:          cm Wt:          kg   Sex:F Purpose:6 Min Walk                           Page 1 88f 1 [id number]                      Position           Sitting Stand  ---     ---    ---     ---           % FIO2             RA      RA     RA      ---    ---     ---  Speed (MPH)        Rest    Rest   Walk    ---    ---     ---           Elevation (%)                                                                             Duration (min)     10:00   5:00   6:00                                            ARTERIAL BLOOD                                                      GASES           PaO2 (mmHg)                                                                               PaCO2 (mmHg)                                                                              PH                                                                                        %O2Hb(Co oximeter)                                                                        SpO2               99      99     94  CARDIOVASCULAR                                                      Heart Rate (bpm)   77      92     129                                              BP systolic (mmHg)                                                                        BP diastolic (mmHg)                                                                        PERCEIVED SYMPTOMS (0 - 10)                                         Fatigue                                1.0                                         Breathlessness                         2.0                                                               Hgb (gm/dl)            COHb (gm%)                                  Technician Comments:Patient completed protocol and walked   .                                  Interpretation:                      The patient walked in 6 minutes. This is a   increase,  which represents a  15% change           from the previous test dated 03/02/2009.                      Dorann Lodge, MD [facility]

## 2011-11-16 NOTE — Procedures (Signed)
No note

## 2011-11-16 NOTE — Progress Notes (Signed)
Attending Addendum:  I examined the patient and reviewed the interval labs/results.  I discussed with Dr. Brayton Layman.  Patient relocated to [facility] and is here for [facility] follow-up.  Overall well with FC II status on combination oral therapies.  Some sinus congestion, but no other significant side-effects.  No JVD, RRR with accentuated P2 and 3/6 HSM; no HM or edema.  Explained role of warfarin in iPAH.  CPM and RTC in 6 months for follow-up.  No echo or RHC necessary at this time.    Pecola Lawless, M.D.  PID# [id number]  Pager [phone]  Pulmonary Hypertension/PTE Attending

## 2011-11-16 NOTE — Progress Notes (Signed)
PULMONARY VASCULAR CLINIC NOTE  Referring Provider: No Pcp, Per Patient  Reason for visit: Follow-up pulmonary arterial hypertension     HPI:  Wanda Mathis is a 56 year old female with history of WHO group I idopathic pulmonary arterial hypertension, possibly related to systemic lupus erythematosus who presents for follow-up after having moved back to [facility] from [place].  She was initially diagnosed with PAH in 2008.  She states that she has done well since she was last seen in this clinic in 2010.  She remains on Tracleer and Adcirca with no side effects.  She states that she can walk her dog for 30 minutes without stopping on a route that includes inclines and small hills in [facility].  She denies syncope or light-headedness.  Her SLE has been under good control with only mild arthritis complaints. Her thyroid replacement was recently reduced.  She remains on warfarin for primary prevention in [facility] and has never had bleeding problems.  No other complaints.     ROS: A twelve-point review of systems was negative unless otherwise noted in the HPI. Patient denies fevers, chills, night sweats. No abdominal pain/nausea/vomiting or stools changes. No rashes.  No other complaints.    Past Medical History   Diagnosis Date   . SLE (systemic lupus erythematosus)    . Hypothyroid    . PAH (pulmonary artery hypertension)      Past Surgical History   Procedure Laterality Date   . No past surgical history       History     Social History   . Marital Status: Single     Spouse Name: N/A     Number of Children: N/A   . Years of Education: N/A     Occupational History   . Marketing for Website      Social History Main Topics   . Smoking status: Former Smoker -- 0.25 packs/day     Types: Cigarettes     Quit date: 11/16/1983   . Smokeless tobacco: Never Used   . Alcohol Use: No   . Drug Use: No      Denies methamphetamines and diet pills   . Sexually Active: Not on file     Other Topics Concern   . Not on file      Social History Narrative   . No narrative on file     Family History   Problem Relation Age of Onset   . Heart Attack Father    . Heart Disease Father      Allergies   Allergen Reactions   . Penicillins Rash and Swelling   . Sulfa Drugs Swelling     Current Outpatient Prescriptions   Medication Sig   . AMITRIPTYLINE HCL 25 MG OR TABS 1 tab at bedtime   . bosentan (TRACLEER) 125 MG tablet Take 125 mg by mouth 2 times daily.   Marland Kitchen levothyroxine (SYNTHROID) 175 MCG tablet Take 135 mcg by mouth daily.   Marland Kitchen PLAQUENIL 200 MG OR TABS 1 tab twice daily   . PREDNISONE 1 MG OR TABS 3 tab daily   . PREDNISONE 5 MG OR TABS 1 tab daily   . Tadalafil, PAH, (ADCIRCA) 20 MG TABS Take 40 mg by mouth daily.   . WARFARIN SODIUM 6 MG OR TABS 1 tab daily alt with 1 1/2 tab every other day     No current facility-administered medications for this visit.       PHYSICAL EXAMINATION  Temperature:  [97.9 F (36.6 C)] 97.9 F (36.6 C) (08/21 1257)  Blood pressure (BP): (131)/(69) 131/69 mmHg (08/21 1257)  Heart Rate:  [82] 82  (08/21 1257)  Pain Score:  [-] 0 (08/21 1257)  O2 Device:  [-]   SpO2:  [95 %] 95 % (08/21 1257)    GEN: NAD, comfortable at rest  HEENT: EOMI, PERRLA, OP without lesions  NECK: Supple, no cervical LAD, JVD flat  CV: RRR, Prominent P2, mild TR murmur II/VI; normal S1/S2, no S3  RESP: CTAB, no wheezes/rales  ABD: soft, NT/ND, NABS  EXT: No LE edema  NEURO: non-focal, grossly intact  SKIN: No rashes    PERTINENT LABS:  LFT's: WNL per TAP program    RIGHT HEART CATHETERIZATION (11/2008): Blood pressure by cuff 112/72, pulse 76 in sinus, saturating 95% on room  air. CVP mean was 1. I ha no difficulty entering the pulmonary artery. RV systolic was 66 with RV EDP 3, PA 59/20 (36), PAOP mean 5 and there  appeared to be a good waveform. PA saturation was 78%. Thermodilution cardiac output 7.17, index 3.6. PVR 4.3 Wood units. Fick cardiac output  estimated at 8.18. Following these measurements the patient was given inhaled  nitric oxide 40 parts per million mixed with ambient air. On iNO, PA pressures recorded 53/17 (32), PAOP mean 5. Cardiac output 6.07, index 3.1, PVR 4.4.     ASSESSMENT:  55 year old  female with history of WHO group I idopathic pulmonary arterial hypertension, possibly related to systemic lupus erythematosus who presents for follow-up; currently well compensated and euvolemic with NYHA Functional Class II on Tracleer and Adcirca    PLAN:  - Continue bosentan 125 mg BID with liver monitoring per T.A.P. Program  - Continue tadalafil 40 mg daily  - Discussed relatively weak evidence for continued warfarin, but okay to continue as long as patient does not find this too burdensome and is not having any bleeding complications  - Return to clinic in 6 months    This patient was seen and discussed with Dr. Selena Batten who agrees with the assessment and plan as outlined above.    Kathi Simpers. Brayton Layman, M.D.  Pulmonary and Critical Care Fellow  Pager [phone]/PID# [id number]

## 2013-10-02 ENCOUNTER — Encounter (HOSPITAL_COMMUNITY): Payer: Self-pay | Admitting: Internal Medicine

## 2013-10-02 DIAGNOSIS — R0989 Other specified symptoms and signs involving the circulatory and respiratory systems: Principal | ICD-10-CM

## 2013-10-02 DIAGNOSIS — R0609 Other forms of dyspnea: Principal | ICD-10-CM

## 2014-01-06 ENCOUNTER — Ambulatory Visit (HOSPITAL_COMMUNITY): Payer: BLUE CROSS/BLUE SHIELD | Admitting: Internal Medicine

## 2014-01-06 ENCOUNTER — Ambulatory Visit (HOSPITAL_COMMUNITY): Payer: BLUE CROSS/BLUE SHIELD

## 2014-01-13 ENCOUNTER — Ambulatory Visit
Admission: RE | Admit: 2014-01-13 | Discharge: 2014-01-14 | Disposition: A | Discharge status: Home / Self Care | Payer: BLUE CROSS/BLUE SHIELD | Source: Ambulatory Visit | Attending: Pulmonary Medicine | Admitting: Pulmonary Medicine

## 2014-01-13 ENCOUNTER — Encounter (HOSPITAL_COMMUNITY): Payer: Self-pay | Admitting: Internal Medicine

## 2014-01-13 ENCOUNTER — Ambulatory Visit (HOSPITAL_COMMUNITY): Payer: BLUE CROSS/BLUE SHIELD

## 2014-01-13 ENCOUNTER — Ambulatory Visit: Payer: BLUE CROSS/BLUE SHIELD | Attending: Internal Medicine | Admitting: Internal Medicine

## 2014-01-13 VITALS — BP 137/84 | HR 55 | Temp 98.0°F | Resp 16 | Ht 68.5 in | Wt 186.2 lb

## 2014-01-13 DIAGNOSIS — R0602 Shortness of breath: Secondary | ICD-10-CM | POA: Insufficient documentation

## 2014-01-13 DIAGNOSIS — I272 Other secondary pulmonary hypertension: Principal | ICD-10-CM | POA: Insufficient documentation

## 2014-01-13 DIAGNOSIS — R0600 Dyspnea, unspecified: Secondary | ICD-10-CM | POA: Insufficient documentation

## 2014-01-13 DIAGNOSIS — R0689 Other abnormalities of breathing: Secondary | ICD-10-CM | POA: Insufficient documentation

## 2014-01-13 DIAGNOSIS — M329 Systemic lupus erythematosus, unspecified: Secondary | ICD-10-CM | POA: Insufficient documentation

## 2014-01-13 DIAGNOSIS — I2721 Secondary pulmonary arterial hypertension: Secondary | ICD-10-CM

## 2014-01-13 NOTE — Procedures (Signed)
No note

## 2014-01-13 NOTE — Progress Notes (Signed)
Pulmonary Hypertension Clinic  [facility], [place], [place], [place]    Attending: Pecola LawlessNick H. Josseline Reddin, M.D. [phone])    Reason for visit: Follow-up for pulmonary hypertension    Patient is here for routine follow-up since her last visit in 2013.  [facility].  She exercises regularly.  She denies problems with her PAH meds or anticoagulation.  She is now off prednisone and plaquenil with stable SLE.    ROS: she needs bunion surgery (outpatient) and colonoscopy in the near future; otherwise noncontributory    Medications:  Current Outpatient Prescriptions   Medication Sig Dispense Refill   . AMITRIPTYLINE HCL 25 MG OR TABS 1 tab at bedtime     . bosentan (TRACLEER) 125 MG tablet Take 125 mg by mouth 2 times daily.     Marland Kitchen. levothyroxine (SYNTHROID) 175 MCG tablet Take 135 mcg by mouth daily.     . Tadalafil, PAH, (ADCIRCA) 20 MG TABS Take 40 mg by mouth daily.     . WARFARIN SODIUM 6 MG OR TABS 1 tab daily alt with 1 1/2 tab every other day       No current facility-administered medications for this visit.       Exam: NAD  BP 137/84 mmHg  Pulse 55  Temp(Src) 98 F (36.7 C) (Oral)  Resp 16  Ht 5' 8.5" (1.74 m)  Wt 84.46 kg (186 lb 3.2 oz)  BMI 27.90 kg/m2  SpO2 97%  HEENT: WNL, OP clear  Neck: no TM, no LAD, supple  No JVD, RRR, snappy P2, +TR, no S3, no S4, no rub  Lungs: CTA  Abd: soft, NT, liver edge not palpable, no ascites  Ext: No edema or cyanosis    Last right heart catheterization: 2010    Impressions/Diagnoses:  1) PAH  2) NYHAC II, doing well  3) Pending procedures  4) Already received flu shot    Recommendations:  1) Option offered to switch from bosentan to macitentan (SERAPHIN data and no monthly LFT check).  She is interested in trying this.  As with any medication start or change, risk of different side effects/tolerability discussed.  In our experience with switching between these two agents (same targeted therapy) we have not had significant problems.  2) Ok to proceed with  procedures [facility] standpoint.  I recommend doing these procedures in the usual manner instead of altering (to less common approach) just because of her PAH.    [facility] or sooner if needed.    All questions were answered.    Pecola LawlessNick H. Kaylib Furness, M.D.  Clinical Professor of Medicine  Pulmonary and Critical Care Medicine  Director, Pulmonary Vascular Program  [facility]  [place], [place]

## 2014-01-15 ENCOUNTER — Ambulatory Visit (HOSPITAL_COMMUNITY): Payer: BLUE CROSS/BLUE SHIELD | Admitting: Internal Medicine

## 2014-01-15 ENCOUNTER — Ambulatory Visit (HOSPITAL_COMMUNITY): Payer: BLUE CROSS/BLUE SHIELD

## 2014-01-16 NOTE — Addendum Note (Signed)
Encounter addended by: Terressa Koyanagihelette, Jenea Dake on: 01/16/2014  8:11 AM<BR>     Documentation filed: Document, Charges VN

## 2014-10-27 ENCOUNTER — Encounter (HOSPITAL_COMMUNITY): Payer: Self-pay | Admitting: Internal Medicine

## 2014-10-27 DIAGNOSIS — R0989 Other specified symptoms and signs involving the circulatory and respiratory systems: Principal | ICD-10-CM

## 2014-10-27 DIAGNOSIS — R0609 Other forms of dyspnea: Principal | ICD-10-CM

## 2014-12-17 ENCOUNTER — Ambulatory Visit (HOSPITAL_COMMUNITY): Payer: Federal, State, Local not specified - PPO | Admitting: Internal Medicine

## 2014-12-17 ENCOUNTER — Ambulatory Visit (HOSPITAL_COMMUNITY): Payer: Federal, State, Local not specified - PPO

## 2015-01-26 ENCOUNTER — Ambulatory Visit: Payer: Federal, State, Local not specified - PPO | Attending: Internal Medicine | Admitting: Internal Medicine

## 2015-01-26 ENCOUNTER — Encounter (HOSPITAL_COMMUNITY): Payer: Self-pay | Admitting: Internal Medicine

## 2015-01-26 VITALS — BP 121/73 | HR 74 | Temp 98.1°F | Resp 17 | Ht 68.5 in | Wt 190.1 lb

## 2015-01-26 DIAGNOSIS — I272 Other secondary pulmonary hypertension: Principal | ICD-10-CM | POA: Insufficient documentation

## 2015-01-26 DIAGNOSIS — I2721 Secondary pulmonary arterial hypertension: Secondary | ICD-10-CM

## 2015-01-26 DIAGNOSIS — R0602 Shortness of breath: Secondary | ICD-10-CM | POA: Insufficient documentation

## 2015-01-26 DIAGNOSIS — M329 Systemic lupus erythematosus, unspecified: Secondary | ICD-10-CM | POA: Insufficient documentation

## 2015-01-26 MED ORDER — MACITENTAN 10 MG PO TABS
10.00 mg | ORAL_TABLET | Freq: Every day | ORAL | Status: DC
Start: ? — End: 2017-12-07

## 2015-01-26 NOTE — Progress Notes (Signed)
Pulmonary Hypertension Clinic    Chief Complaint:  Feeling well    History of Present Illness:  Wanda Mathis is a 59 year old female with history of WHO Group I PAH associated with SLE who presents for follow up. She is currently on adcirca and macitentan. She was last seen about 1 year ago at which time she was doing well. She was changed from bosentan to macitentan. Currently she is feeling very well. No side effects to the medications. Did not note any difference since switching to maci. Able to walk > 2hours at a time. Lifts weights 2 times per week. Denies any functional limitation other than maybe doing really intense aerobic work outs. Denies any syncope or chest pain. No lower extremity edema or abdominal distention. No bleeding complications related to coumadin. Says lupus is well controlled and is off immunosuppressants.     Past Medical History:  Past Medical History   Diagnosis Date   . Hypothyroid    . PAH (pulmonary artery hypertension) (CMS-HCC)    . SLE (systemic lupus erythematosus) (CMS-HCC)      Past Surgical History   Procedure Laterality Date   . No past surgical history         Allergies:  Allergies   Allergen Reactions   . Penicillins Rash and Swelling   . Sulfa Drugs Swelling       Medications:    (Not in a hospital admission)    Current Outpatient Prescriptions on File Prior to Visit   Medication Sig Dispense Refill   . AMITRIPTYLINE HCL 25 MG OR TABS 1 tab at bedtime     . bosentan (TRACLEER) 125 MG tablet Take 125 mg by mouth 2 times daily.     Marland Kitchen. levothyroxine (SYNTHROID) 175 MCG tablet Take 135 mcg by mouth daily.     . Tadalafil, PAH, (ADCIRCA) 20 MG TABS Take 40 mg by mouth daily.     . WARFARIN SODIUM 6 MG OR TABS 1 tab daily alt with 1 1/2 tab every other day       No current facility-administered medications on file prior to visit.        Social History:  Social History     Social History   . Marital status: Single     Spouse name: N/A   . Number of children: N/A   .  Years of education: N/A     Occupational History   . Marketing for Website      Social History Main Topics   . Smoking status: Former Smoker     Packs/day: 0.25     Types: Cigarettes     Quit date: 11/16/1983   . Smokeless tobacco: Never Used   . Alcohol use No   . Drug use: No      Comment: Denies methamphetamines and diet pills   . Sexual activity: Not on file     Other Topics Concern   . Not on file     Social History Narrative       Family History:  Family History   Problem Relation Age of Onset   . Heart Attack Father    . HEART DISEAS Father        Review of Systems:  10 organ ROS performed and pertinent positive and negatives are documented in HPI    Physical Exam:  BP 121/73  Pulse 74  Temp 98.1 F (36.7 C) (Oral)  Resp 17  Ht 5' 8.5" (1.74 m)  Wt 86.2 kg (190 lb 1.6 oz)  SpO2 98%  BMI 28.48 kg/m2  Gen: no acute distress, alert and oriented  Eyes: EOMI, anicteric  Neck: supple full rom  Mouth: op clear with no oral lesions  Lungs: clear bilaterally, no wheezing  CV: regular, enhanced p2, no s3 or jvd  Abd: soft, nt, +BS, no hm  Ext: no edema, warm  Skin: no rashes or sores  Neuro: non focal, moving all extremities    Labs:  Lab Results   Component Value Date    WBC 3.5 12/16/2008    HGB 13.7 12/16/2008    HCT 42.4 12/16/2008    PLT 159 12/16/2008     Lab Results   Component Value Date    NA 143 12/16/2008    K 3.4 12/16/2008    CL 107 12/16/2008    BICARB 28 12/16/2008    BUN 14 12/16/2008    CREAT 0.72 12/16/2008    GLU 73 12/16/2008    [place] 9.4 12/16/2008     Lab Results   Component Value Date    PT 11.4 12/16/2008    PTT 35.2 12/16/2008    INR 1.0 12/16/2008     No results found for: ARTPH, ARTPCO2, ARTPO2     Last RHC 2010    Assessment and Plan: Wanda Mathis is a 59 year old female with history of WHO Group I PAH associated with SLE who presents for follow up.    1. PAH: Overall remains stable. Currently NYHA FC II. Doing well on current therapy.   - continue macitentan and adcirca  - she is  considering temporary relocation to [facility], can put her in contact with [facility] docs out there if needed    2. SLE: controlled.     RTC 1 year    Seen/discussed with Dr. Eliot Ford  PCCM Fellow    Orders:  No orders of the defined types were placed in this encounter.    No orders of the defined types were placed in this encounter.    No orders of the defined types were placed in this encounter.

## 2015-01-29 NOTE — Progress Notes (Signed)
Attending Addendum:  I examined the patient and reviewed the interval labs/results.  I discussed with Dr. Patterson HammersmithMims and agree with his note.  Stable FC II on current therapy.  [place] [facility] (D[place]. Test or Monia PouchFortin[place] [facility]Dr[facility] Ginger OrganMathai).  Meantime, CPM.  RTC in 1 year otherwise is not relocating.    Pecola LawlessNick H. Taja Pentland, M.D.  PID# [id number]  Pager [phone]  Pulmonary Hypertension/PTE Attending

## 2016-04-25 ENCOUNTER — Encounter (HOSPITAL_COMMUNITY): Payer: Self-pay | Admitting: Internal Medicine

## 2016-04-25 ENCOUNTER — Ambulatory Visit: Payer: BLUE CROSS/BLUE SHIELD | Attending: Internal Medicine | Admitting: Internal Medicine

## 2016-04-25 VITALS — BP 108/73 | HR 91 | Temp 98.0°F | Resp 16 | Ht 68.5 in | Wt 188.7 lb

## 2016-04-25 DIAGNOSIS — I2721 Secondary pulmonary arterial hypertension: Principal | ICD-10-CM | POA: Insufficient documentation

## 2016-04-25 DIAGNOSIS — E038 Other specified hypothyroidism: Secondary | ICD-10-CM | POA: Insufficient documentation

## 2016-04-25 DIAGNOSIS — R0602 Shortness of breath: Secondary | ICD-10-CM | POA: Insufficient documentation

## 2016-04-25 DIAGNOSIS — M3219 Other organ or system involvement in systemic lupus erythematosus: Secondary | ICD-10-CM | POA: Insufficient documentation

## 2016-04-25 NOTE — Progress Notes (Signed)
Pulmonary Hypertension Clinic  [facility], [place], [place], [place]    Attending:                  Pecola LawlessNick H. Kim, M.D. [phone]  Fellow:                       Abe PeopleS. Clark Isam Unrein, MD ([phone])    CC  DOE    History of Present Illness:  Waylan BogaMary Bridget Mathis is a 61 year old female with history of WHO Group I PAH associated with SLE and hyperparathyroidism who presents for follow up. She is currently on adcirca and macitentan. She exercises regularly, can ambulate on flat ground for > 1 mile, lifts weights 2 times per week. Difficulty with inclines and stairs but overall no significant functional limitation. Denies any syncope or chest pain. No lower extremity edema or abdominal distention. No bleeding complications related to coumadin. [place] in [employer] but prefers to [facility] care at [facility]. Says lupus is well controlled and is off immunosuppressants. [facility].    Past Medical History:  Past Medical History:   Diagnosis Date   . Hypothyroid    . PAH (pulmonary artery hypertension)    . SLE (systemic lupus erythematosus) (CMS-HCC)      Past Surgical History:   Procedure Laterality Date   . NO PAST SURGICAL HISTORY       Allergies:  Allergies   Allergen Reactions   . Penicillins Rash and Swelling   . Sulfa Drugs Swelling     Medications:    (Not in a hospital admission)    Current Outpatient Prescriptions on File Prior to Visit   Medication Sig Dispense Refill   . AMITRIPTYLINE HCL 25 MG OR TABS 1 tab at bedtime     . bosentan (TRACLEER) 125 MG tablet Take 125 mg by mouth 2 times daily.     Marland Kitchen. levothyroxine (SYNTHROID) 175 MCG tablet Take 135 mcg by mouth daily.     . macitentan (OPSUMIT) 10 MG tablet Take 10 mg by mouth daily.     . Tadalafil, PAH, (ADCIRCA) 20 MG TABS Take 40 mg by mouth daily.     . WARFARIN SODIUM 6 MG OR TABS 1 tab daily alt with 1 1/2 tab every other day       No current facility-administered medications on file prior to visit.      Social  History:  Social History     Social History   . Marital status: Single     Spouse name: N/A   . Number of children: N/A   . Years of education: N/A     Occupational History   . Marketing for [employer]      Social History Main Topics   . Smoking status: Former Smoker     Packs/day: 0.25     Types: Cigarettes     Quit date: 11/16/1983   . Smokeless tobacco: Never Used   . Alcohol use No   . Drug use: No      Comment: Denies methamphetamines and diet pills   . Sexual activity: Not Asked     Social Activities of Daily Living Present   . None     Social History Narrative     Family History:  Family History   Problem Relation Age of Onset   . Heart Attack Father    . HEART DISEAS Father  Review of Systems:  A 14 point review of systems was obtained as was negative accept for HPI    Physical Exam:  There were no vitals taken for this visit.  Gen: no acute distress, alert and oriented  Eyes: EOMI, anicteric  Neck: supple full rom  Mouth: op clear with no oral lesions  Lungs: clear bilaterally, no wheezing  CV: regular, enhanced p2, mild TR, no s3 or jvd,  Abd: soft, nt, +BS, no hm  Ext: trace edema, warm  Skin: no rashes or sores  Neuro: non focal, moving all extremities    Labs:  Lab Results   Component Value Date    WBC 3.5 12/16/2008    HGB 13.7 12/16/2008    HCT 42.4 12/16/2008    PLT 159 12/16/2008     Lab Results   Component Value Date    NA 143 12/16/2008    K 3.4 12/16/2008    CL 107 12/16/2008    BICARB 28 12/16/2008    BUN 14 12/16/2008    CREAT 0.72 12/16/2008    GLU 73 12/16/2008    [place] 9.4 12/16/2008     Lab Results   Component Value Date    PT 11.4 12/16/2008    PTT 35.2 12/16/2008    INR 1.0 12/16/2008     No results found for: Staci Righter, ARTPO2     Last RHC 2010    Assessment and Plan  Leighton Luster is a 61 year old female with history of WHO Group I PAH associated with SLE who presents for follow up.    1. PAH: Overall doing excellent Currently NYHA FC I. Doing well on current therapy.   -  continue macitentan and adcirca  - she is considering temporary relocation to [facility], can put her in contact with [facility] docs out there if needed  - encouraged to continue to exercise    2. SLE: controlled.     RTC 1 year    Seen/discussed with Dr. Selena Batten  ________________  Abe People, MD  PCCM fellow  [phone]

## 2016-04-25 NOTE — Progress Notes (Addendum)
Attending Addendum:  I examined the patient and reviewed the interval labs/results.  I discussed with Dr. Thayer HeadingsBerngard and agree with his note.  FC I doing well, staying active on combination ERA/PDE5i therapies.  No side effects to report.  Follows with rheumatology -- no active SLE and not on targeted therapy.  [place] [facility].  No evidence of RVF on exam.  No JVD, RRR, +TR, no S3, no HSM, no sign edema.  CPM and return in 1 year or sooner for follow-up.  S/p flu shot.    Pecola LawlessNick H. Kwamaine Cuppett, M.D.  PID# [id number]  Pager [phone]  Pulmonary Hypertension/PTE Attending

## 2017-04-19 ENCOUNTER — Encounter (HOSPITAL_COMMUNITY): Payer: Self-pay | Admitting: Internal Medicine

## 2017-04-19 ENCOUNTER — Ambulatory Visit: Payer: BLUE CROSS/BLUE SHIELD | Attending: Internal Medicine | Admitting: Internal Medicine

## 2017-04-19 VITALS — BP 151/88 | HR 96 | Temp 98.5°F | Resp 20 | Ht 68.0 in | Wt 188.8 lb

## 2017-04-19 DIAGNOSIS — M328 Other forms of systemic lupus erythematosus: Secondary | ICD-10-CM | POA: Insufficient documentation

## 2017-04-19 DIAGNOSIS — I2721 Secondary pulmonary arterial hypertension: Principal | ICD-10-CM | POA: Insufficient documentation

## 2017-04-19 NOTE — Progress Notes (Signed)
Pulmonary Hypertension Clinic  [facility], [place], [place], [place]    Attending: Pecola LawlessNick H. Akeelah Seppala, M.D. [phone])    Reason for visit: Follow-up for pulmonary hypertension    Patient reports overall doing well from [facility] standpoint.  She is on both ERA/PDE5i and has no limitations.  FC I.  No CP, lightheadedness, syncope.  She is also still on VKA for PAH and tolerating anticoagulation.  She has some SLE issues with her eyes -- appt pending.  No interval hospitalizations or serious medical issues.    ROS: otherwise noncontributory    Medications:  Current Outpatient Medications   Medication Sig Dispense Refill   . AMITRIPTYLINE HCL 25 MG OR TABS 1 tab at bedtime     . levothyroxine (SYNTHROID) 175 MCG tablet Take 135 mcg by mouth daily.     . macitentan (OPSUMIT) 10 MG tablet Take 10 mg by mouth daily.     . Tadalafil, PAH, (ADCIRCA) 20 MG TABS Take 40 mg by mouth daily.     . WARFARIN SODIUM 6 MG OR TABS 1 tab daily alt with 1 1/2 tab every other day       No current facility-administered medications for this visit.        Exam: NAD  BP 151/88 (BP Location: Right arm, BP Patient Position: Sitting, BP cuff size: Regular)   Pulse 96   Temp 98.5 F (36.9 C) (Oral)   Resp 20   Ht 5\' 8"  (1.727 m)   Wt 85.6 kg (188 lb 12.8 oz)   SpO2 99%   BMI 28.71 kg/m   HEENT: WNL, OP clear  Neck: no TM, no LAD, supple  No JVD, RRR, accentuated P2, +TR, no S3, no S4, no rub  Lungs: CTA  Abd: soft, NT, liver edge not palpable, no ascites  Ext: No edema or cyanosis    Last right heart catheterization: 2010    Impressions/Diagnoses:  1) PAH/SLE  2) NYHAC I  3) SLE  4) Up to date with immunizations    Recommendations:  1) Continue current PAH medications.  2) Interest in seeing if can come off warfarin.  [facility] other than idiopathic PAH.  Recommend checking for antiphospholipid ab or lupus anticoagulant.  If both negative, I would favor stopping anticoagulation.    Return to clinic in one  year.    All questions were answered.    Pecola LawlessNick H. Bettyanne Dittman, M.D.  Clinical Professor of Medicine  Pulmonary and Critical Care Medicine  Director, Pulmonary Vascular Program   [facility]  [place]

## 2017-12-07 ENCOUNTER — Other Ambulatory Visit (HOSPITAL_COMMUNITY): Payer: Self-pay

## 2017-12-07 DIAGNOSIS — I2721 Secondary pulmonary arterial hypertension: Principal | ICD-10-CM

## 2017-12-07 MED ORDER — MACITENTAN 10 MG PO TABS
10.0000 mg | ORAL_TABLET | Freq: Every day | ORAL | 6 refills | Status: DC
Start: 2017-12-07 — End: 2018-01-05

## 2018-01-05 ENCOUNTER — Other Ambulatory Visit (HOSPITAL_COMMUNITY): Payer: Self-pay

## 2018-01-05 DIAGNOSIS — I2721 Secondary pulmonary arterial hypertension: Principal | ICD-10-CM

## 2018-01-05 MED ORDER — MACITENTAN 10 MG PO TABS
10.0000 mg | ORAL_TABLET | Freq: Every day | ORAL | 6 refills | Status: DC
Start: 2018-01-05 — End: 2018-01-26

## 2018-01-22 ENCOUNTER — Other Ambulatory Visit (HOSPITAL_COMMUNITY): Payer: Self-pay | Admitting: Pharmacist Clinician (PhC)/ Clinical Pharmacy Specialist

## 2018-01-22 DIAGNOSIS — I2721 Secondary pulmonary arterial hypertension: Secondary | ICD-10-CM

## 2018-01-22 MED ORDER — TADALAFIL (PAH) 20 MG PO TABS
40.0000 mg | ORAL_TABLET | Freq: Every day | ORAL | 5 refills | Status: DC
Start: 2018-01-22 — End: 2018-07-04

## 2018-01-23 ENCOUNTER — Encounter (HOSPITAL_COMMUNITY): Payer: Self-pay

## 2018-01-23 DIAGNOSIS — I2721 Secondary pulmonary arterial hypertension: Principal | ICD-10-CM

## 2018-01-23 NOTE — Progress Notes (Signed)
Spoke with [facility] in [place].  Patient has 5 refills remaining.

## 2018-01-26 MED ORDER — MACITENTAN 10 MG PO TABS
10.0000 mg | ORAL_TABLET | Freq: Every day | ORAL | 6 refills | Status: DC
Start: 2018-01-26 — End: 2018-07-11

## 2018-01-26 NOTE — Addendum Note (Signed)
Addended by: Alicia Amel on: 01/26/2018 02:56 PM     Modules accepted: Orders

## 2018-04-23 ENCOUNTER — Ambulatory Visit (HOSPITAL_COMMUNITY): Payer: BLUE CROSS/BLUE SHIELD | Admitting: Internal Medicine

## 2018-06-06 ENCOUNTER — Ambulatory Visit (HOSPITAL_COMMUNITY): Payer: BLUE CROSS/BLUE SHIELD | Admitting: Internal Medicine

## 2018-07-04 ENCOUNTER — Other Ambulatory Visit (HOSPITAL_COMMUNITY): Payer: Self-pay

## 2018-07-04 DIAGNOSIS — I2721 Secondary pulmonary arterial hypertension: Secondary | ICD-10-CM

## 2018-07-04 MED ORDER — TADALAFIL (PAH) 20 MG PO TABS
40.0000 mg | ORAL_TABLET | Freq: Every day | ORAL | 5 refills | Status: DC
Start: 2018-07-04 — End: 2019-01-14

## 2018-07-11 ENCOUNTER — Other Ambulatory Visit (HOSPITAL_COMMUNITY): Payer: Self-pay

## 2018-07-11 DIAGNOSIS — I2721 Secondary pulmonary arterial hypertension: Principal | ICD-10-CM

## 2018-07-11 MED ORDER — MACITENTAN 10 MG PO TABS
10.0000 mg | ORAL_TABLET | Freq: Every day | ORAL | 6 refills | Status: DC
Start: 2018-07-11 — End: 2019-01-21

## 2019-01-14 ENCOUNTER — Other Ambulatory Visit (HOSPITAL_COMMUNITY): Payer: Self-pay | Admitting: Pharmacist Clinician (PhC)/ Clinical Pharmacy Specialist

## 2019-01-14 DIAGNOSIS — I2721 Secondary pulmonary arterial hypertension: Secondary | ICD-10-CM

## 2019-01-14 MED ORDER — TADALAFIL (PAH) 20 MG PO TABS
40.0000 mg | ORAL_TABLET | Freq: Every day | ORAL | 5 refills | Status: DC
Start: 2019-01-14 — End: 2019-01-21

## 2019-01-21 ENCOUNTER — Ambulatory Visit: Payer: BLUE CROSS/BLUE SHIELD | Attending: Internal Medicine | Admitting: Internal Medicine

## 2019-01-21 ENCOUNTER — Other Ambulatory Visit: Payer: Self-pay

## 2019-01-21 DIAGNOSIS — M359 Systemic involvement of connective tissue, unspecified: Secondary | ICD-10-CM | POA: Insufficient documentation

## 2019-01-21 DIAGNOSIS — I2721 Secondary pulmonary arterial hypertension: Secondary | ICD-10-CM | POA: Insufficient documentation

## 2019-01-21 MED ORDER — OPSUMIT 10 MG PO TABS
10.0000 mg | ORAL_TABLET | Freq: Every day | ORAL | 11 refills | Status: DC
Start: 2019-01-21 — End: 2019-02-20

## 2019-01-21 MED ORDER — TADALAFIL (PAH) 20 MG PO TABS
40.0000 mg | ORAL_TABLET | Freq: Every day | ORAL | 11 refills | Status: DC
Start: 2019-01-21 — End: 2019-09-27

## 2019-01-21 MED ORDER — TADALAFIL (PAH) 20 MG PO TABS
40.0000 mg | ORAL_TABLET | Freq: Every day | ORAL | 5 refills | Status: DC
Start: 2019-01-21 — End: 2019-01-21

## 2019-01-21 NOTE — Addendum Note (Signed)
Addended by: Charmian Muff on: 01/21/2019 03:11 PM     Modules accepted: Orders

## 2019-01-21 NOTE — Progress Notes (Signed)
Pulmonary Hypertension Clinic - Telemedicine Visit  [facility], [place], [place], [place]    Attending: Aretta Nip, M.D. [phone])    Reason for visit: Follow-up for pulmonary hypertension    Patient moved to [facility] - adjusting to the changes.  FC I on combination of opsumit and adcirca.  No side effects.  No lightheadedness, CP or hospitalizations.  Has PCP and local rheumatology if needed.  No SLE meds at the moment and doing well.  Received flu shot and shingles vaccine.  Working full time.    Medications:  Current Outpatient Medications   Medication Sig Dispense Refill   . levothyroxine (SYNTHROID) 175 MCG tablet Take 135 mcg by mouth daily.     . macitentan (OPSUMIT) 10 MG tablet Take 1 tablet (10 mg) by mouth daily. 30 tablet 6   . tadalafil, PAH, (ADCIRCA) 20 MG tablet Take 2 tablets (40 mg) by mouth daily. 60 tablet 5     No current facility-administered medications for this visit.        Impressions/Diagnoses:  1) PAH-SLE  2) NYHAC I on combination oral PAH therapies  3) Compliant with PH medications    Recommendations:  1) Continue current PAH medications.  2) Check NT-proBNP in January when she gets her annual labs.  3) Offer another telemedicine visit in 6 mo.    All questions were answered.    Aretta Nip, M.D.  Clinical Professor of Medicine  Pulmonary Vascular Medicine

## 2019-02-20 ENCOUNTER — Other Ambulatory Visit (HOSPITAL_COMMUNITY): Payer: Self-pay | Admitting: Pharmacist Clinician (PhC)/ Clinical Pharmacy Specialist

## 2019-02-20 DIAGNOSIS — I2721 Secondary pulmonary arterial hypertension: Secondary | ICD-10-CM

## 2019-02-20 MED ORDER — OPSUMIT 10 MG PO TABS
10.0000 mg | ORAL_TABLET | Freq: Every day | ORAL | 11 refills | Status: DC
Start: 2019-02-20 — End: 2020-03-17

## 2019-04-11 ENCOUNTER — Encounter: Payer: Self-pay | Admitting: Hospital

## 2019-06-25 ENCOUNTER — Encounter (INDEPENDENT_AMBULATORY_CARE_PROVIDER_SITE_OTHER): Payer: Self-pay | Admitting: Hospital

## 2019-06-25 ENCOUNTER — Encounter (INDEPENDENT_AMBULATORY_CARE_PROVIDER_SITE_OTHER): Payer: Self-pay

## 2019-09-27 ENCOUNTER — Other Ambulatory Visit (HOSPITAL_COMMUNITY): Payer: Self-pay

## 2019-09-27 DIAGNOSIS — I2721 Secondary pulmonary arterial hypertension: Secondary | ICD-10-CM

## 2019-09-27 MED ORDER — TADALAFIL (PAH) 20 MG PO TABS
40.0000 mg | ORAL_TABLET | Freq: Every day | ORAL | 11 refills | Status: DC
Start: 2019-09-27 — End: 2019-10-03

## 2019-10-03 ENCOUNTER — Other Ambulatory Visit (HOSPITAL_COMMUNITY): Payer: Self-pay

## 2019-10-03 DIAGNOSIS — I2721 Secondary pulmonary arterial hypertension: Secondary | ICD-10-CM

## 2019-10-03 MED ORDER — TADALAFIL (PAH) 20 MG PO TABS
40.00 mg | ORAL_TABLET | Freq: Every day | ORAL | 11 refills | Status: AC
Start: 2019-10-03 — End: ?

## 2019-10-11 ENCOUNTER — Telehealth (HOSPITAL_COMMUNITY): Payer: Self-pay

## 2019-10-11 NOTE — Telephone Encounter (Signed)
Received a denial on opsumit from [employer].  Appeal done by phone.  PA [id number] approved until 04/12/20

## 2020-03-12 ENCOUNTER — Other Ambulatory Visit (HOSPITAL_COMMUNITY): Payer: Self-pay | Admitting: Internal Medicine

## 2020-03-12 DIAGNOSIS — I2721 Secondary pulmonary arterial hypertension: Secondary | ICD-10-CM

## 2020-03-17 ENCOUNTER — Other Ambulatory Visit (HOSPITAL_COMMUNITY): Payer: Self-pay

## 2020-03-17 DIAGNOSIS — I2721 Secondary pulmonary arterial hypertension: Secondary | ICD-10-CM

## 2020-03-17 MED ORDER — OPSUMIT 10 MG PO TABS
10.0000 mg | ORAL_TABLET | Freq: Every day | ORAL | 11 refills | Status: DC
Start: 2020-03-17 — End: 2020-04-30

## 2020-04-27 ENCOUNTER — Encounter (HOSPITAL_COMMUNITY): Payer: Self-pay

## 2020-04-27 DIAGNOSIS — I2721 Secondary pulmonary arterial hypertension: Secondary | ICD-10-CM

## 2020-04-27 MED ORDER — TADALAFIL (PAH) 20 MG PO TABS
40.0000 mg | ORAL_TABLET | Freq: Every day | ORAL | 11 refills | Status: DC
Start: 2020-04-27 — End: 2021-04-19

## 2020-04-30 ENCOUNTER — Other Ambulatory Visit (HOSPITAL_COMMUNITY): Payer: Self-pay

## 2020-04-30 DIAGNOSIS — I2721 Secondary pulmonary arterial hypertension: Secondary | ICD-10-CM

## 2020-04-30 MED ORDER — OPSUMIT 10 MG PO TABS
10.0000 mg | ORAL_TABLET | Freq: Every day | ORAL | 11 refills | Status: DC
Start: 2020-04-30 — End: 2020-05-20

## 2020-05-20 ENCOUNTER — Other Ambulatory Visit (HOSPITAL_COMMUNITY): Payer: Self-pay

## 2020-05-20 DIAGNOSIS — I2721 Secondary pulmonary arterial hypertension: Secondary | ICD-10-CM

## 2020-05-20 MED ORDER — OPSUMIT 10 MG PO TABS
10.0000 mg | ORAL_TABLET | Freq: Every day | ORAL | 11 refills | Status: DC
Start: 2020-05-20 — End: 2021-04-19

## 2020-10-20 ENCOUNTER — Telehealth (HOSPITAL_COMMUNITY): Payer: Self-pay

## 2020-10-20 NOTE — Telephone Encounter (Signed)
Per our conversation,   1. Use over-the-counter medications when necessary to treat Covid symptoms. Stay away from any over the counter medications that have Pseudoephedrine, Sudafed or any combination medication (DM).  2. If you are having a hard time breathing, worsening shortness of breath or an increase use of oxygen, that is a sign that you or a family member should contact a medical provider right away.  3. Reach out to your primary Doctor to discuss antiviral options.

## 2021-04-18 ENCOUNTER — Other Ambulatory Visit: Payer: Self-pay

## 2021-04-19 ENCOUNTER — Ambulatory Visit: Payer: BLUE CROSS/BLUE SHIELD | Attending: Internal Medicine | Admitting: Internal Medicine

## 2021-04-19 VITALS — BP 145/54 | HR 65 | Temp 97.3°F | Resp 18 | Wt 193.0 lb

## 2021-04-19 DIAGNOSIS — I2721 Secondary pulmonary arterial hypertension: Secondary | ICD-10-CM | POA: Insufficient documentation

## 2021-04-19 DIAGNOSIS — M328 Other forms of systemic lupus erythematosus: Secondary | ICD-10-CM | POA: Insufficient documentation

## 2021-04-19 MED ORDER — OPSUMIT 10 MG PO TABS
10.0000 mg | ORAL_TABLET | Freq: Every day | ORAL | 11 refills | Status: DC
Start: 2021-04-19 — End: 2022-05-09

## 2021-04-19 MED ORDER — TADALAFIL (PAH) 20 MG PO TABS
40.0000 mg | ORAL_TABLET | Freq: Every day | ORAL | 11 refills | Status: DC
Start: 2021-04-19 — End: 2021-06-21

## 2021-04-19 NOTE — Progress Notes (Signed)
Pulmonary Hypertension Clinic  [facility], [place], [place], [place]    Attending: Pecola Lawless, M.D. [phone])    Reason for visit: Follow-up for pulmonary hypertension    Patient is visiting from [facility] and here for follow-up. No issues from [facility] standpoint. Tolerating both PAH meds, no new side effects. FC I.  She notices some seasonal allergy symptoms and asking about starting MDI. No history of asthma/COPD. She stopped flonase after COVID infection this summer. The infection then was mainly head cold, not needing hospitalization. She is considering move back to [place].    ROS: SLE under control. No targeted therapy for SLE. Previously had fever, arthralgia, rash. otherwise noncontributory    Medications:  Current Outpatient Medications on File Prior to Visit   Medication Sig Dispense Refill   . levothyroxine (SYNTHROID) 175 MCG tablet Take 135 mcg by mouth daily.     . macitentan (OPSUMIT) 10 MG tablet Take 1 tablet (10 mg) by mouth daily. 30 tablet 11   . tadalafil, PAH, (ADCIRCA) 20 MG tablet Take 2 tablets (40 mg) by mouth daily. 60 tablet 11     No current facility-administered medications on file prior to visit.         Exam: NAD   04/19/21  1249   BP: (!) 145/54   Pulse: 65   Resp: 18   Temp: 97.3 F (36.3 C)   SpO2: 96%     Alert, NAD  HEENT: WNL, OP clear  Neck: no TM  No JVD, RRR, soft TR, no S3, no S4, no rub  Lungs: CTA  Abd: soft, NT, liver edge not palpable  Ext: No edema or cyanosis    Last right heart catheterization: 2010    Impressions/Diagnoses:  1) PAH/SLE  2) NYHAC I  3) SLE    Recommendations:  1) Continue current PAH medications.  2) For PH assessment, transthoracic echocardiogram either back home in [place] (and send [place] report) or when she returns.    Return to clinic in one year.    All questions were answered.    Pecola Lawless, M.D.  Clinical Professor of Medicine  Pulmonary and Critical Care Medicine  Director, Pulmonary Vascular Program  [facility]  [place], [place]

## 2021-06-21 ENCOUNTER — Other Ambulatory Visit (HOSPITAL_COMMUNITY): Payer: Self-pay | Admitting: Pharmacist Clinician (PhC)/ Clinical Pharmacy Specialist

## 2021-06-21 DIAGNOSIS — I2721 Secondary pulmonary arterial hypertension: Secondary | ICD-10-CM

## 2021-06-21 MED ORDER — TADALAFIL (PAH) 20 MG PO TABS
40.0000 mg | ORAL_TABLET | Freq: Every day | ORAL | 11 refills | Status: DC
Start: 2021-06-21 — End: 2022-05-09

## 2021-07-26 IMAGING — MG DIGITAL SCREENING BILAT W/ TOMO W/ CAD
8 series · 8 of 24 positions shown · non-contrast
Comparison: Previous exam(s).

CLINICAL DATA: Screening.

EXAM:
DIGITAL SCREENING BILATERAL MAMMOGRAM WITH TOMO AND CAD

[L MLO synth-2D]
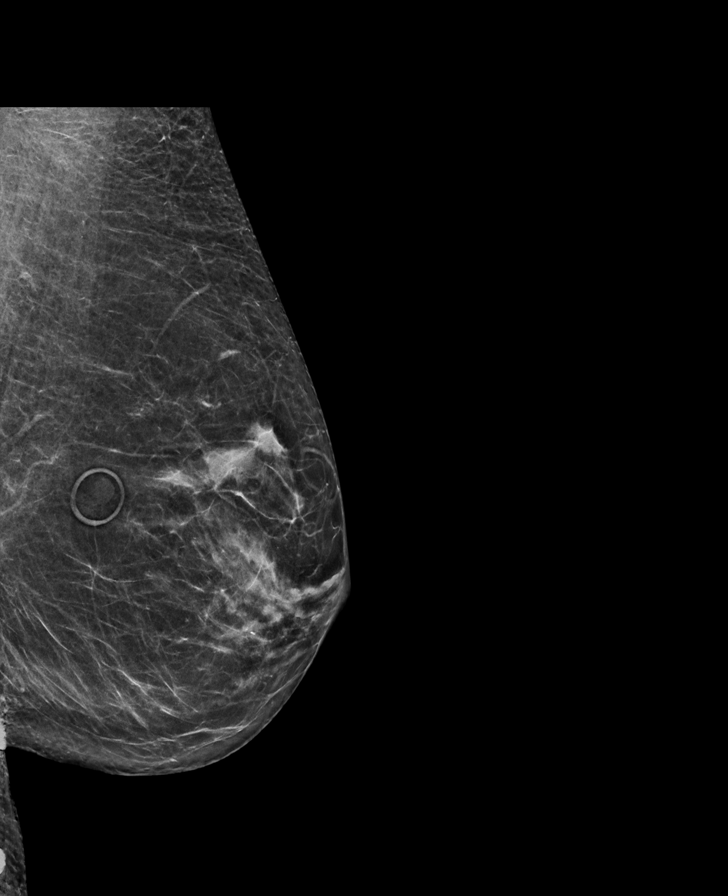

[R MLO synth-2D]
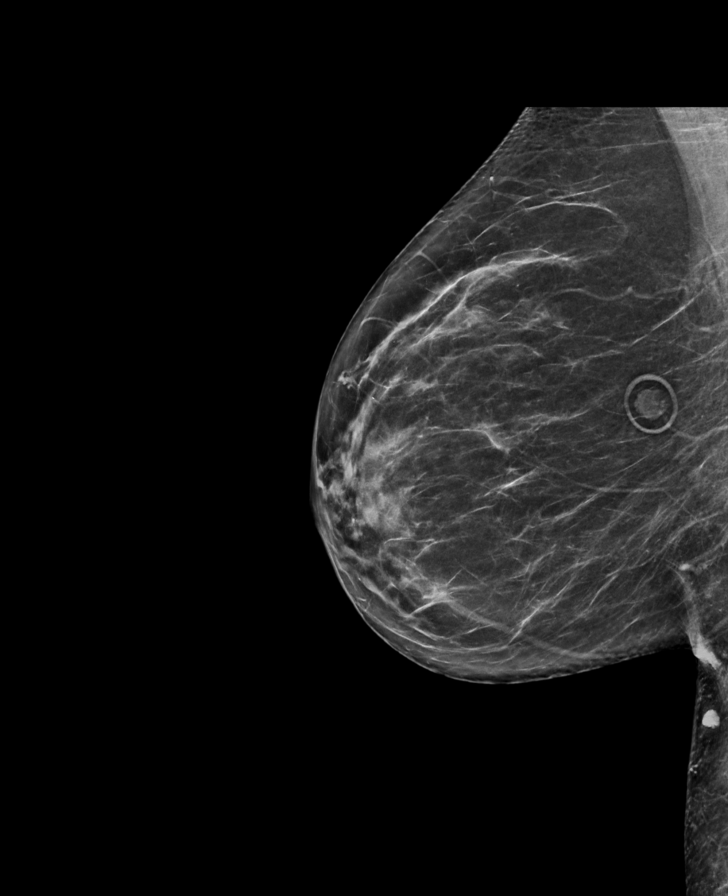

[L CC synth-2D]
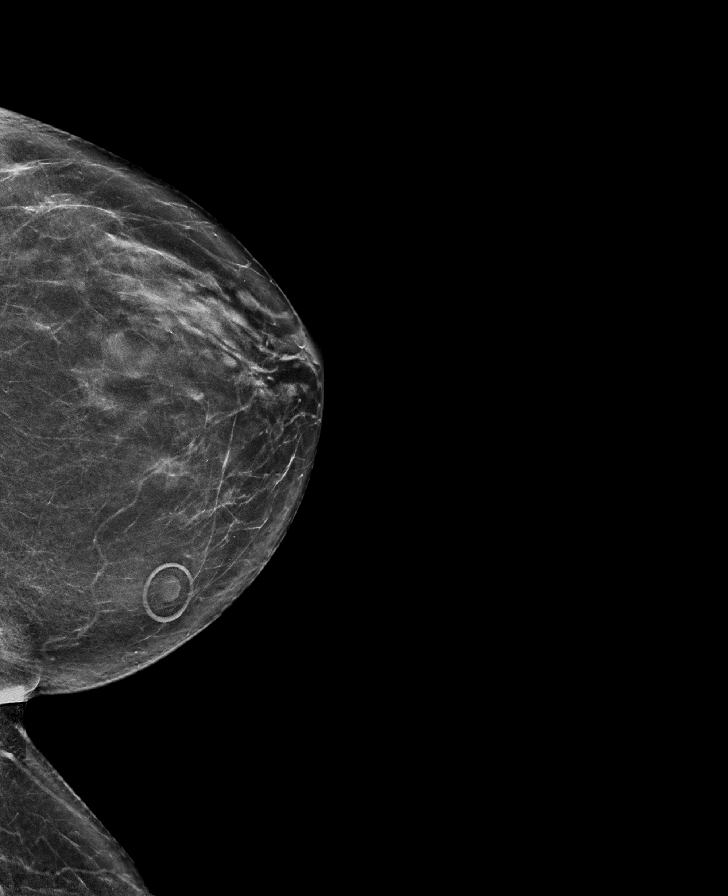

[R CC synth-2D]
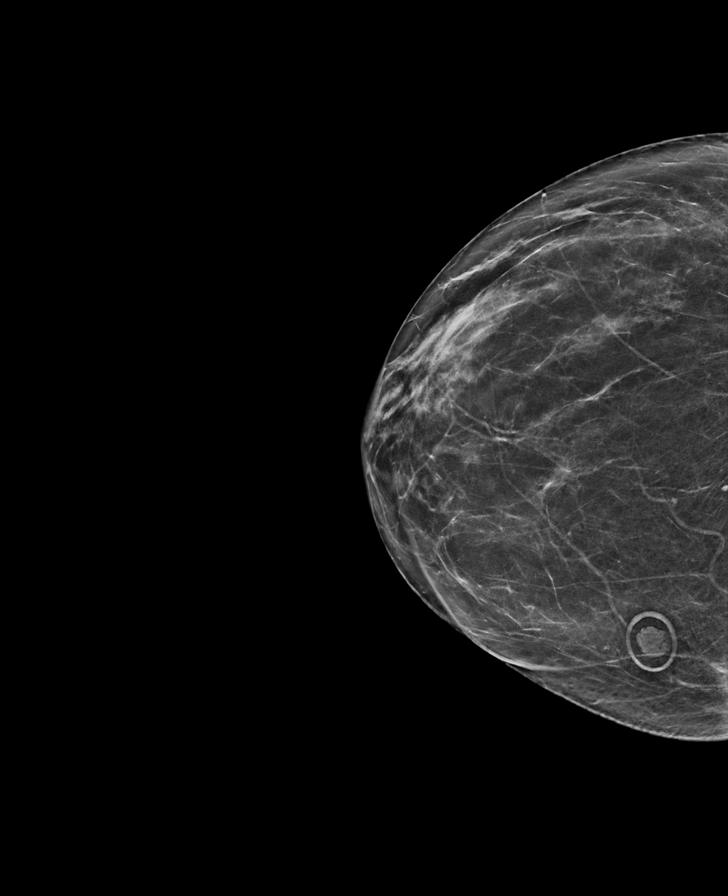

[R CC tomo · tomo slice 34/67.0]
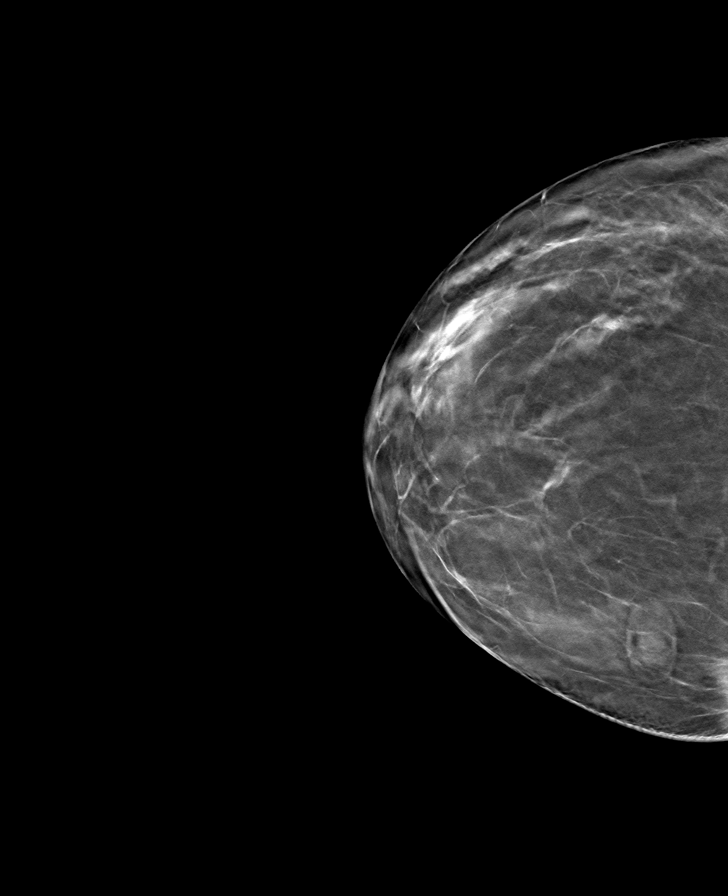

[L MLO tomo · tomo slice 33/64.0]
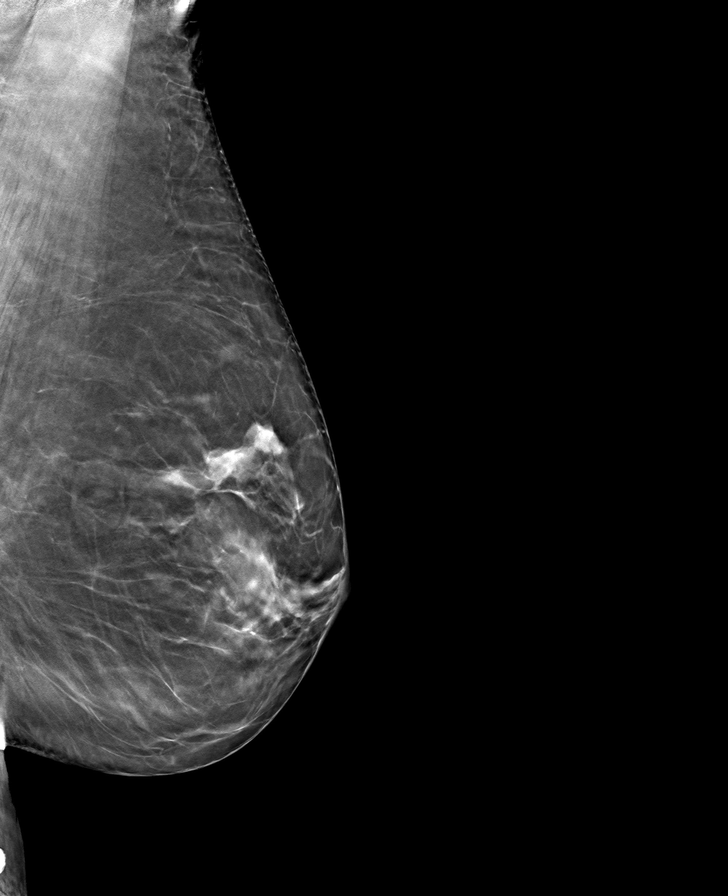

[L CC tomo · tomo slice 34/67.0]
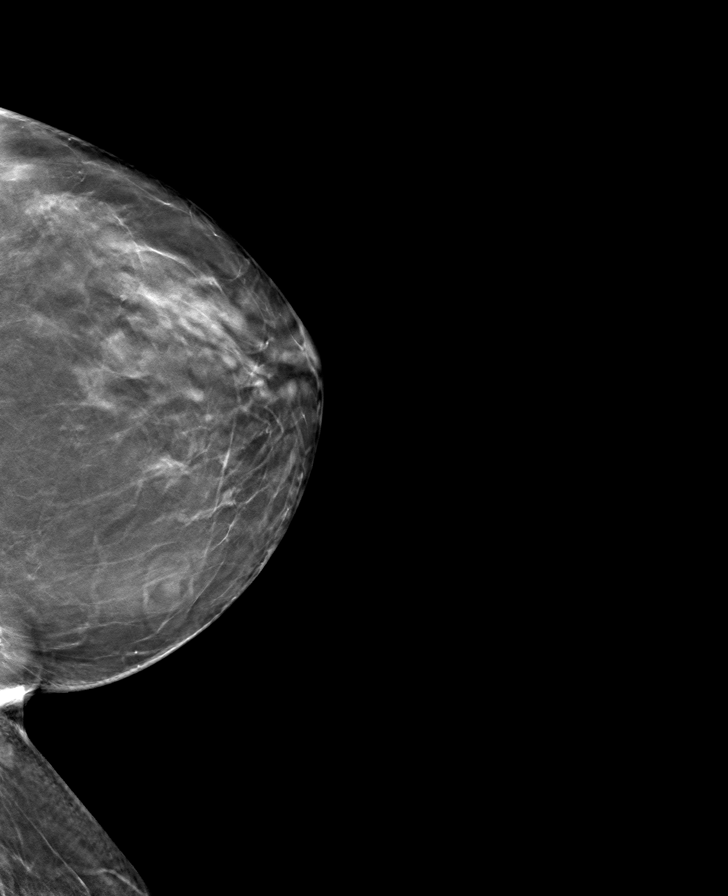

[R MLO tomo · tomo slice 35/70.0]
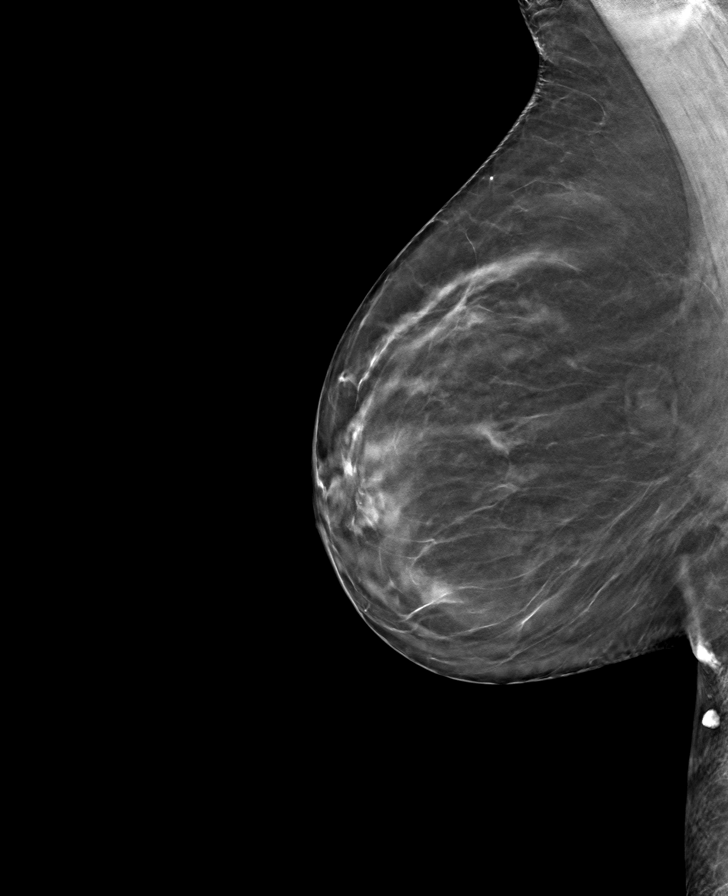

[8 of 24 positions shown; findings below may reference images not displayed]

ACR Breast Density Category b: There are scattered areas of
fibroglandular density.
FINDINGS: There are no findings suspicious for malignancy. Images were
processed with CAD.
IMPRESSION: No mammographic evidence of malignancy. A result letter of this
screening mammogram will be mailed directly to the patient.

RECOMMENDATION:
Screening mammogram in one year. (Code:[id number])

BI-RADS CATEGORY  1: Negative.

## 2022-05-02 ENCOUNTER — Ambulatory Visit: Payer: PPO | Attending: Internal Medicine | Admitting: Internal Medicine

## 2022-05-02 VITALS — BP 159/80 | HR 76 | Temp 98.2°F | Wt 200.0 lb

## 2022-05-02 DIAGNOSIS — M328 Other forms of systemic lupus erythematosus: Secondary | ICD-10-CM | POA: Insufficient documentation

## 2022-05-02 DIAGNOSIS — I2721 Secondary pulmonary arterial hypertension: Secondary | ICD-10-CM | POA: Insufficient documentation

## 2022-05-02 NOTE — Progress Notes (Signed)
Pulmonary Hypertension Clinic  [facility], [place], [place], [place]     Attending: Aretta Nip, M.D. [phone])     Reason for visit: Follow-up for pulmonary hypertension     Patient is here from [facility] for her annual [facility] follow-up. She denies new problems. She has been off SLE medications for some time without symptoms. She has new PCP and is about to establish care with regional rheumatologist. Active, exercising. Has some DOE with inclines. No new side effects to ERA/PDE5i. She notes long standing color discrimination issue but no new blurriness or other visual defects.     ROS: denies typical SLE symptoms (arthralgia, swelling)     Medications:  Current Outpatient Medications on File Prior to Visit   Medication Sig Dispense Refill    levothyroxine (SYNTHROID) 175 MCG tablet Take 135 mcg by mouth daily.      macitentan (OPSUMIT) 10 MG tablet Take 1 tablet (10 mg) by mouth daily. 30 tablet 11    tadalafil, PAH, (ADCIRCA) 20 MG tablet Take 2 tablets (40 mg) by mouth daily. 60 tablet 11     No current facility-administered medications on file prior to visit.          Temperature:  [98.2 F (36.8 C)] 98.2 F (36.8 C) (02/05 1516)  Blood pressure (BP): (159)/(80) 159/80 (02/05 1516)  Heart Rate:  [76] 76 (02/05 1516)  SpO2:  [98 %] 98 % (02/05 1516)    Alert, NAD  No JVD, RRR, soft TR, no S3, no S4, no rub  Lungs: CTA  Abd: soft, NT, liver edge not palpable  Ext: No edema     Last right heart catheterization: 2010    Echo report reviewed: Mild RA, RV enlargement; mild MR, NL IVC, no pericardial effusion, RVSP 63     Impressions/Diagnoses:  1) PAH/SLE  2) NYHAC I  3) SLE - stable off therapy; planning to establish care with new rheumatologist     Recommendations:  1) Continue current dual oral PAH medications.  2) Importance of SLE management as part of PAH-SLE discussed. I support her plans to have a regional SLE specialist as part of her care team.  3) No RHC or changes to [facility] meds needed.  4) RTC in one year  with NT-proBNP. If she prefers to stay regionally, I can recommend [facility] expert colleagues in [facility] or [place] area.     All questions were answered.     Aretta Nip, M.D.  Clinical Professor of Medicine  Pulmonary and Critical Care Medicine  Director, Pulmonary Vascular Program  [place], [place]

## 2022-05-09 ENCOUNTER — Other Ambulatory Visit (HOSPITAL_COMMUNITY): Payer: Self-pay | Admitting: Pharmacist Clinician (PhC)/ Clinical Pharmacy Specialist

## 2022-05-09 DIAGNOSIS — I2721 Secondary pulmonary arterial hypertension: Secondary | ICD-10-CM

## 2022-05-09 MED ORDER — TADALAFIL (PAH) 20 MG PO TABS
40.0000 mg | ORAL_TABLET | Freq: Every day | ORAL | 11 refills | Status: AC
Start: 2022-05-09 — End: ?

## 2022-05-09 MED ORDER — OPSUMIT 10 MG PO TABS
10.0000 mg | ORAL_TABLET | Freq: Every day | ORAL | 11 refills | Status: DC
Start: 2022-05-09 — End: 2022-12-01

## 2022-12-01 ENCOUNTER — Other Ambulatory Visit (HOSPITAL_COMMUNITY): Payer: Self-pay

## 2022-12-01 DIAGNOSIS — I2721 Secondary pulmonary arterial hypertension: Secondary | ICD-10-CM

## 2022-12-01 MED ORDER — OPSUMIT 10 MG PO TABS
10.0000 mg | ORAL_TABLET | Freq: Every day | ORAL | 11 refills | Status: DC
Start: 2022-12-01 — End: 2022-12-02

## 2022-12-02 ENCOUNTER — Other Ambulatory Visit (HOSPITAL_COMMUNITY): Payer: Self-pay | Admitting: Pharmacist Clinician (PhC)/ Clinical Pharmacy Specialist

## 2022-12-02 DIAGNOSIS — I2721 Secondary pulmonary arterial hypertension: Secondary | ICD-10-CM

## 2022-12-02 MED ORDER — OPSUMIT 10 MG PO TABS
10.0000 mg | ORAL_TABLET | Freq: Every day | ORAL | 11 refills | Status: DC
Start: 2022-12-02 — End: 2023-12-11

## 2022-12-02 NOTE — Telephone Encounter (Signed)
Change of insurance. [employer] insurance dictates using [facility] for opsumit.

## 2023-08-14 ENCOUNTER — Other Ambulatory Visit (HOSPITAL_COMMUNITY): Payer: Self-pay

## 2023-08-14 DIAGNOSIS — I2721 Secondary pulmonary arterial hypertension: Secondary | ICD-10-CM

## 2023-08-14 MED ORDER — TADALAFIL (PAH) 20 MG PO TABS
40.0000 mg | ORAL_TABLET | Freq: Every day | ORAL | 11 refills | Status: DC
Start: 2023-08-14 — End: 2023-12-11

## 2023-12-11 ENCOUNTER — Other Ambulatory Visit (HOSPITAL_COMMUNITY): Payer: Self-pay | Admitting: Internal Medicine

## 2023-12-11 DIAGNOSIS — I2721 Secondary pulmonary arterial hypertension: Secondary | ICD-10-CM

## 2023-12-11 MED ORDER — TADALAFIL (PAH) 20 MG PO TABS
40.0000 mg | ORAL_TABLET | Freq: Every day | ORAL | 5 refills | Status: AC
Start: 2023-12-11 — End: ?

## 2023-12-11 MED ORDER — OPSUMIT 10 MG PO TABS
10.0000 mg | ORAL_TABLET | Freq: Every day | ORAL | 5 refills | Status: AC
Start: 2023-12-11 — End: ?

## 2023-12-11 NOTE — Addendum Note (Signed)
 Addended by: ALENA HOUSE on: 12/11/2023 03:41 PM     Modules accepted: Orders

## 2023-12-11 NOTE — Telephone Encounter (Signed)
 Patient had to reschedule appt. Still under the care of  [facility] [place] [facility].

## 2023-12-26 NOTE — Telephone Encounter (Signed)
 Duplicate request
